# Patient Record
Sex: Male | Born: 1935 | Race: White | Hispanic: No | Marital: Married | Smoking: Never smoker
Health system: Southern US, Community
[De-identification: ages and names within clinical notes are randomized; demographics above are authoritative.]

## PROBLEM LIST (undated history)

## (undated) DIAGNOSIS — Z860100 Personal history of colon polyps, unspecified: Secondary | ICD-10-CM

## (undated) DIAGNOSIS — E785 Hyperlipidemia, unspecified: Secondary | ICD-10-CM

## (undated) DIAGNOSIS — E119 Type 2 diabetes mellitus without complications: Secondary | ICD-10-CM

## (undated) DIAGNOSIS — I10 Essential (primary) hypertension: Secondary | ICD-10-CM

## (undated) DIAGNOSIS — N400 Enlarged prostate without lower urinary tract symptoms: Secondary | ICD-10-CM

## (undated) DIAGNOSIS — M5136 Other intervertebral disc degeneration, lumbar region: Secondary | ICD-10-CM

## (undated) DIAGNOSIS — F419 Anxiety disorder, unspecified: Secondary | ICD-10-CM

## (undated) DIAGNOSIS — K219 Gastro-esophageal reflux disease without esophagitis: Secondary | ICD-10-CM

## (undated) DIAGNOSIS — M51369 Other intervertebral disc degeneration, lumbar region without mention of lumbar back pain or lower extremity pain: Secondary | ICD-10-CM

## (undated) DIAGNOSIS — J0191 Acute recurrent sinusitis, unspecified: Secondary | ICD-10-CM

## (undated) DIAGNOSIS — K222 Esophageal obstruction: Secondary | ICD-10-CM

## (undated) DIAGNOSIS — Z9109 Other allergy status, other than to drugs and biological substances: Secondary | ICD-10-CM

## (undated) DIAGNOSIS — Z87442 Personal history of urinary calculi: Secondary | ICD-10-CM

## (undated) HISTORY — PX: COLONOSCOPY: SHX174

## (undated) HISTORY — PX: SHOULDER SURGERY: SHX246

## (undated) HISTORY — PX: KNEE SURGERY: SHX244

## (undated) HISTORY — DX: Essential (primary) hypertension: I10

## (undated) HISTORY — PX: OTHER SURGICAL HISTORY: SHX169

## (undated) HISTORY — PX: HERNIA REPAIR: SHX51

## (undated) HISTORY — PX: CATARACT EXTRACTION W/ INTRAOCULAR LENS IMPLANT: SHX1309

## (undated) HISTORY — PX: HEMORRHOID SURGERY: SHX153

---

## 2002-02-28 ENCOUNTER — Encounter: Payer: Self-pay | Admitting: Urology

## 2002-03-02 ENCOUNTER — Ambulatory Visit (HOSPITAL_BASED_OUTPATIENT_CLINIC_OR_DEPARTMENT_OTHER): Admission: RE | Admit: 2002-03-02 | Discharge: 2002-03-02 | Payer: Self-pay | Admitting: Urology

## 2002-03-02 ENCOUNTER — Encounter: Payer: Self-pay | Admitting: Urology

## 2004-08-21 ENCOUNTER — Ambulatory Visit: Payer: Self-pay | Admitting: Unknown Physician Specialty

## 2004-11-13 ENCOUNTER — Ambulatory Visit: Payer: Self-pay | Admitting: Unknown Physician Specialty

## 2007-05-26 ENCOUNTER — Ambulatory Visit: Payer: Self-pay | Admitting: Unknown Physician Specialty

## 2007-08-30 ENCOUNTER — Emergency Department: Payer: Self-pay | Admitting: Emergency Medicine

## 2007-09-09 ENCOUNTER — Emergency Department: Payer: Self-pay | Admitting: Emergency Medicine

## 2008-06-14 ENCOUNTER — Ambulatory Visit: Payer: Self-pay | Admitting: Urology

## 2009-01-08 ENCOUNTER — Ambulatory Visit: Payer: Self-pay | Admitting: Internal Medicine

## 2009-03-26 ENCOUNTER — Ambulatory Visit: Payer: Self-pay | Admitting: Unknown Physician Specialty

## 2010-09-12 ENCOUNTER — Ambulatory Visit: Payer: Self-pay | Admitting: Unknown Physician Specialty

## 2010-09-13 LAB — PATHOLOGY REPORT

## 2011-09-15 ENCOUNTER — Ambulatory Visit: Payer: Self-pay | Admitting: Unknown Physician Specialty

## 2011-09-17 LAB — PATHOLOGY REPORT

## 2012-10-01 ENCOUNTER — Ambulatory Visit: Payer: Self-pay | Admitting: Internal Medicine

## 2012-11-09 ENCOUNTER — Emergency Department: Payer: Self-pay | Admitting: Emergency Medicine

## 2012-11-18 ENCOUNTER — Emergency Department: Payer: Self-pay | Admitting: Emergency Medicine

## 2013-03-31 ENCOUNTER — Encounter: Payer: Self-pay | Admitting: Podiatry

## 2013-04-04 ENCOUNTER — Ambulatory Visit: Payer: Self-pay | Admitting: Podiatry

## 2013-04-07 ENCOUNTER — Ambulatory Visit (INDEPENDENT_AMBULATORY_CARE_PROVIDER_SITE_OTHER): Payer: Medicare Other | Admitting: Podiatry

## 2013-04-07 ENCOUNTER — Encounter: Payer: Self-pay | Admitting: Podiatry

## 2013-04-07 VITALS — BP 157/92 | HR 83 | Resp 18 | Ht 67.0 in | Wt 150.0 lb

## 2013-04-07 DIAGNOSIS — L600 Ingrowing nail: Secondary | ICD-10-CM

## 2013-04-07 NOTE — Progress Notes (Signed)
He took these ingrown toenails out , and it was bothering me last week now its better , so  i figured i let him check them out.  Objective: Vital signs are stable he is alert and oriented x3. He has a slight nail dystrophy to the hallux bilateral. Pulses are bilaterally palpable.  Assessment: Nail dystrophy no ingrown toenails to the hallux bilateral.  Plan: Debridement a small portion of the nail distally and I will followup with him as needed appear

## 2013-06-29 DIAGNOSIS — M5137 Other intervertebral disc degeneration, lumbosacral region: Secondary | ICD-10-CM | POA: Insufficient documentation

## 2013-06-29 DIAGNOSIS — M1990 Unspecified osteoarthritis, unspecified site: Secondary | ICD-10-CM | POA: Insufficient documentation

## 2013-06-29 DIAGNOSIS — I10 Essential (primary) hypertension: Secondary | ICD-10-CM | POA: Insufficient documentation

## 2013-06-29 DIAGNOSIS — K635 Polyp of colon: Secondary | ICD-10-CM | POA: Insufficient documentation

## 2013-06-29 DIAGNOSIS — F419 Anxiety disorder, unspecified: Secondary | ICD-10-CM | POA: Insufficient documentation

## 2013-06-29 DIAGNOSIS — N200 Calculus of kidney: Secondary | ICD-10-CM | POA: Insufficient documentation

## 2013-06-29 DIAGNOSIS — R739 Hyperglycemia, unspecified: Secondary | ICD-10-CM | POA: Insufficient documentation

## 2013-06-29 DIAGNOSIS — E785 Hyperlipidemia, unspecified: Secondary | ICD-10-CM | POA: Insufficient documentation

## 2013-09-16 DIAGNOSIS — K219 Gastro-esophageal reflux disease without esophagitis: Secondary | ICD-10-CM | POA: Insufficient documentation

## 2013-09-16 DIAGNOSIS — R1310 Dysphagia, unspecified: Secondary | ICD-10-CM | POA: Insufficient documentation

## 2013-09-16 DIAGNOSIS — K222 Esophageal obstruction: Secondary | ICD-10-CM

## 2013-09-29 ENCOUNTER — Ambulatory Visit: Payer: Self-pay | Admitting: Unknown Physician Specialty

## 2013-10-04 LAB — PATHOLOGY REPORT

## 2014-11-01 ENCOUNTER — Encounter: Payer: Self-pay | Admitting: Emergency Medicine

## 2014-11-01 ENCOUNTER — Emergency Department
Admission: EM | Admit: 2014-11-01 | Discharge: 2014-11-01 | Disposition: A | Discharge status: Home / Self Care | Payer: Medicare Other | Attending: Emergency Medicine | Admitting: Emergency Medicine

## 2014-11-01 DIAGNOSIS — Y998 Other external cause status: Secondary | ICD-10-CM | POA: Diagnosis not present

## 2014-11-01 DIAGNOSIS — Y9289 Other specified places as the place of occurrence of the external cause: Secondary | ICD-10-CM | POA: Insufficient documentation

## 2014-11-01 DIAGNOSIS — I10 Essential (primary) hypertension: Secondary | ICD-10-CM | POA: Insufficient documentation

## 2014-11-01 DIAGNOSIS — S71112A Laceration without foreign body, left thigh, initial encounter: Secondary | ICD-10-CM | POA: Diagnosis not present

## 2014-11-01 DIAGNOSIS — Y9389 Activity, other specified: Secondary | ICD-10-CM | POA: Insufficient documentation

## 2014-11-01 DIAGNOSIS — Y288XXA Contact with other sharp object, undetermined intent, initial encounter: Secondary | ICD-10-CM | POA: Insufficient documentation

## 2014-11-01 DIAGNOSIS — Z79899 Other long term (current) drug therapy: Secondary | ICD-10-CM | POA: Diagnosis not present

## 2014-11-01 DIAGNOSIS — Z880 Allergy status to penicillin: Secondary | ICD-10-CM | POA: Insufficient documentation

## 2014-11-01 MED ORDER — OXYCODONE-ACETAMINOPHEN 7.5-325 MG PO TABS: 1.0000 | ORAL_TABLET | Freq: Four times a day (QID) | ORAL | Status: DC | PRN

## 2014-11-01 MED ORDER — LIDOCAINE-EPINEPHRINE (PF) 1 %-1:200000 IJ SOLN
INTRAMUSCULAR | Status: AC
  Filled 2014-11-01: qty 30

## 2014-11-01 MED ORDER — DOUBLE ANTIBIOTIC 500-10000 UNIT/GM EX OINT
TOPICAL_OINTMENT | Freq: Once | CUTANEOUS | Status: DC
  Filled 2014-11-01: qty 1

## 2014-11-01 MED ORDER — BACITRACIN-NEOMYCIN-POLYMYXIN OINTMENT TUBE
TOPICAL_OINTMENT | Freq: Once | CUTANEOUS | Status: AC
  Administered 2014-11-01: 15:00:00 via TOPICAL
  Filled 2014-11-01: qty 15

## 2014-11-01 MED ORDER — BACITRACIN-NEOMYCIN-POLYMYXIN 400-5-5000 EX OINT
TOPICAL_OINTMENT | CUTANEOUS | Status: AC
  Filled 2014-11-01: qty 1

## 2014-11-01 MED ORDER — BACITRACIN-NEOMYCIN-POLYMYXIN OINTMENT TUBE: TOPICAL_OINTMENT | Freq: Once | CUTANEOUS | Status: DC

## 2014-11-01 NOTE — ED Notes (Signed)
AAOx3.  Skin warm and dry.  NAD 

## 2014-11-01 NOTE — ED Provider Notes (Signed)
[facility] Emergency Department Provider Note  ____________________________________________  Time seen: Approximately 2:07 PM  I have reviewed the triage vital signs and the nursing notes.   HISTORY  Chief Complaint Extremity Laceration    HPI Robert Alexander is a 79 y.o. male patient were 5 cm laceration to the left upper anterior thigh. Laceration from skill saw. Hemorrhage and control with direct pressure.Patient denies any loss sensation or movement of the left lower extremity. Patient is rating his pain as a 10 over 10. No palliative measures except for pressure dressing for this complaint. Patient states tetanus shot is up-to-date.   Past Medical History  Diagnosis Date  . Hypertension     There are no active problems to display for this patient.   Past Surgical History  Procedure Laterality Date  . Kidney stones      Current Outpatient Rx  Name  Route  Sig  Dispense  Refill  . amLODipine (NORVASC) 5 MG tablet               . LORazepam (ATIVAN) 1 MG tablet               . oxyCODONE-acetaminophen (PERCOCET) 7.5-325 MG tablet   Oral   Take 1 tablet by mouth every 6 (six) hours as needed for severe pain.   12 tablet   0     Allergies Penicillins  No family history on file.  Social History Social History  Substance Use Topics  . Smoking status: Never Smoker   . Smokeless tobacco: Current User    Types: Chew  . Alcohol Use: No    Review of Systems Constitutional: No fever/chills Eyes: No visual changes. ENT: No sore throat. Cardiovascular: Denies chest pain. Respiratory: Denies shortness of breath. Gastrointestinal: No abdominal pain.  No nausea, no vomiting.  No diarrhea.  No constipation. Genitourinary: Negative for dysuria. Musculoskeletal: Negative for back pain. Skin: Negative for rash. Laceration left thigh. Neurological: Negative for headaches, focal weakness or  numbness. Endocrine:Hypertension Allergic/Immunilogical: Penicillin 10-point ROS otherwise negative.  ____________________________________________   PHYSICAL EXAM:  VITAL SIGNS: ED Triage Vitals  Enc Vitals Group     BP 11/01/14 1344 152/76 mmHg     Pulse Rate 11/01/14 1344 102     Resp 11/01/14 1344 20     Temp 11/01/14 1344 97.7 F (36.5 C)     Temp Source 11/01/14 1344 Oral     SpO2 11/01/14 1344 96 %     Weight 11/01/14 1344 150 lb (68.04 kg)     Height 11/01/14 1344  (1.702 m)     Head Cir --      Peak Flow --      Pain Score 11/01/14 1345 10     Pain Loc --      Pain Edu? --      Excl. in GC? --     Constitutional: Alert and oriented. Well appearing and in no acute distress. Eyes: Conjunctivae are normal. PERRL. EOMI. Head: Atraumatic. Nose: No congestion/rhinnorhea. Mouth/Throat: Mucous membranes are moist.  Oropharynx non-erythematous. Neck: No stridor.  No cervical spine tenderness to palpation. Hematological/Lymphatic/Immunilogical: No cervical lymphadenopathy. Cardiovascular: Normal rate, regular rhythm. Grossly normal heart sounds.  Good peripheral circulation. Mild elevation of blood pressure Respiratory: Normal respiratory effort.  No retractions. Lungs CTAB. Gastrointestinal: Soft and nontender. No distention. No abdominal bruits. No CVA tenderness. Musculoskeletal: No lower extremity tenderness nor edema.  No joint effusions. Neurologic:  Normal speech and language. No gross  focal neurologic deficits are appreciated. No gait instability. Skin:  Skin is warm, dry and intact. No rash noted. 5 cm laceration anterior left thigh. Psychiatric: Mood and affect are normal. Speech and behavior are normal.  ____________________________________________   LABS (all labs ordered are listed, but only abnormal results are displayed)  Labs Reviewed - No data to  display ____________________________________________  EKG   ____________________________________________  RADIOLOGY   ____________________________________________   PROCEDURES  Procedure(s) performed: See procedure note  LACERATION REPAIR Performed by: Joni Reining Authorized by: Joni Reining Consent: Verbal consent obtained. Risks and benefits: risks, benefits and alternatives were discussed Consent given by: patient Patient identity confirmed: provided demographic data Prepped and Draped in normal sterile fashion Wound explored  Laceration Location: Anterior left upper thigh  Laceration Length: 5 cm  No Foreign Bodies seen or palpated  Anesthesia: local infiltration  Local anesthetic: lidocaine 1% with epinephrine  Anesthetic total: 10 ML's   Irrigation method: syringe Amount of cleaning: standard  Skin closure: 4-0 Vicryls and 3-0 nylon   Number of sutures: A subcutaneous and 11 cutaneous  Technique: Simple Patient tolerance: Patient tolerated the procedure well with no immediate complications.  Critical Care performed: No  ____________________________________________   INITIAL IMPRESSION / ASSESSMENT AND PLAN / ED COURSE  Pertinent labs & imaging results that were available during my care of the patient were reviewed by me and considered in my medical decision making (see chart for details). Laceration left thigh. Patient given advice on wound care. Patient get a prescription for Percocets to take as directed for pain. Patient advised return back in 10 days for suture removal either by his PCP or the ER ____________________________________________   FINAL CLINICAL IMPRESSION(S) / ED DIAGNOSES  Final diagnoses:  Thigh laceration, left, initial encounter      Joni Reining, PA-C 11/01/14 1454  Richardean Canal, MD 11/01/14 [phone]

## 2014-11-01 NOTE — ED Notes (Addendum)
Pt with three inch laceration to left upper thigh, 3 layers deep with a skill saw. Cms intact.

## 2014-11-01 NOTE — Discharge Instructions (Signed)
Laceration Care, Adult  A laceration is a cut that goes through all layers of the skin. The cut also goes into the tissue that is right under the skin. Some cuts heal on their own. Others need to be closed with stitches (sutures), staples, skin adhesive strips, or wound glue. Taking care of your cut lowers your risk of infection and helps your cut to heal better.  HOW TO TAKE CARE OF YOUR CUT  For stitches or staples:  · Keep the wound clean and dry.  · If you were given a bandage (dressing), you should change it at least one time per day or as told by your doctor. You should also change it if it gets wet or dirty.  · Keep the wound completely dry for the first 24 hours or as told by your doctor. After that time, you may take a shower or a bath. However, make sure that the wound is not soaked in water until after the stitches or staples have been removed.  · Clean the wound one time each day or as told by your doctor:    Wash the wound with soap and water.    Rinse the wound with water until all of the soap comes off.    Pat the wound dry with a clean towel. Do not rub the wound.  · After you clean the wound, put a thin layer of antibiotic ointment on it as told by your doctor. This ointment:    Helps to prevent infection.    Keeps the bandage from sticking to the wound.  · Have your stitches or staples removed as told by your doctor.  If your doctor used skin adhesive strips:   · Keep the wound clean and dry.  · If you were given a bandage, you should change it at least one time per day or as told by your doctor. You should also change it if it gets dirty or wet.  · Do not get the skin adhesive strips wet. You can take a shower or a bath, but be careful to keep the wound dry.  · If the wound gets wet, pat it dry with a clean towel. Do not rub the wound.  · Skin adhesive strips fall off on their own. You can trim the strips as the wound heals. Do not remove any strips that are still stuck to the wound. They will  fall off after a while.  If your doctor used wound glue:  · Try to keep your wound dry, but you may briefly wet it in the shower or bath. Do not soak the wound in water, such as by swimming.  · After you take a shower or a bath, gently pat the wound dry with a clean towel. Do not rub the wound.  · Do not do any activities that will make you really sweaty until the skin glue has fallen off on its own.  · Do not apply liquid, cream, or ointment medicine to your wound while the skin glue is still on.  · If you were given a bandage, you should change it at least one time per day or as told by your doctor. You should also change it if it gets dirty or wet.  · If a bandage is placed over the wound, do not let the tape for the bandage touch the skin glue.  · Do not pick at the glue. The skin glue usually stays on for 5-10 days. Then, it   falls off of the skin.  General Instructions   · To help prevent scarring, make sure to cover your wound with sunscreen whenever you are outside after stitches are removed, after adhesive strips are removed, or when wound glue stays in place and the wound is healed. Make sure to wear a sunscreen of at least 30 SPF.  · Take over-the-counter and prescription medicines only as told by your doctor.  · If you were given antibiotic medicine or ointment, take or apply it as told by your doctor. Do not stop using the antibiotic even if your wound is getting better.  · Do not scratch or pick at the wound.  · Keep all follow-up visits as told by your doctor. This is important.  · Check your wound every day for signs of infection. Watch for:    Redness, swelling, or pain.    Fluid, blood, or pus.  · Raise (elevate) the injured area above the level of your heart while you are sitting or lying down, if possible.  GET HELP IF:  · You got a tetanus shot and you have any of these problems at the injection site:    Swelling.    Very bad pain.    Redness.    Bleeding.  · You have a fever.  · A wound that was  closed breaks open.  · You notice a bad smell coming from your wound or your bandage.  · You notice something coming out of the wound, such as wood or glass.  · Medicine does not help your pain.  · You have more redness, swelling, or pain at the site of your wound.  · You have fluid, blood, or pus coming from your wound.  · You notice a change in the color of your skin near your wound.  · You need to change the bandage often because fluid, blood, or pus is coming from the wound.  · You start to have a new rash.  · You start to have numbness around the wound.  GET HELP RIGHT AWAY IF:  · You have very bad swelling around the wound.  · Your pain suddenly gets worse and is very bad.  · You notice painful lumps near the wound or on skin that is anywhere on your body.  · You have a red streak going away from your wound.  · The wound is on your hand or foot and you cannot move a finger or toe like you usually can.  · The wound is on your hand or foot and you notice that your fingers or toes look pale or bluish.     This information is not intended to replace advice given to you by your health care provider. Make sure you discuss any questions you have with your health care provider.     Document Released: 07/02/2007 Document Revised: 05/30/2014 Document Reviewed: 01/09/2014  Elsevier Interactive Patient Education ©2016 [employer]

## 2014-11-13 ENCOUNTER — Emergency Department
Admission: EM | Admit: 2014-11-13 | Discharge: 2014-11-13 | Disposition: A | Discharge status: Home / Self Care | Payer: Medicare Other | Attending: Emergency Medicine | Admitting: Emergency Medicine

## 2014-11-13 DIAGNOSIS — Z4801 Encounter for change or removal of surgical wound dressing: Secondary | ICD-10-CM | POA: Insufficient documentation

## 2014-11-13 DIAGNOSIS — Z79899 Other long term (current) drug therapy: Secondary | ICD-10-CM | POA: Insufficient documentation

## 2014-11-13 DIAGNOSIS — I10 Essential (primary) hypertension: Secondary | ICD-10-CM | POA: Diagnosis not present

## 2014-11-13 DIAGNOSIS — Z880 Allergy status to penicillin: Secondary | ICD-10-CM | POA: Diagnosis not present

## 2014-11-13 DIAGNOSIS — Z4802 Encounter for removal of sutures: Secondary | ICD-10-CM

## 2014-11-13 NOTE — ED Provider Notes (Signed)
[facility] Emergency Department Provider Note   ____________________________________________  Time seen: Approximately 9:32 AM  I have reviewed the triage vital signs and the nursing notes.   HISTORY  Chief Complaint Suture / Staple Removal    HPI Robert Alexander is a 79 y.o. male who presents for suture removal. He was here 11/01/2014 for laceration from a skill saw, which was subsequently sutured in the ED.    Past Medical History  Diagnosis Date  . Hypertension     There are no active problems to display for this patient.   Past Surgical History  Procedure Laterality Date  . Kidney stones      Current Outpatient Rx  Name  Route  Sig  Dispense  Refill  . amLODipine (NORVASC) 5 MG tablet               . LORazepam (ATIVAN) 1 MG tablet               . oxyCODONE-acetaminophen (PERCOCET) 7.5-325 MG tablet   Oral   Take 1 tablet by mouth every 6 (six) hours as needed for severe pain.   12 tablet   0     Allergies Penicillins  No family history on file.  Social History Social History  Substance Use Topics  . Smoking status: Never Smoker   . Smokeless tobacco: Current User    Types: Chew  . Alcohol Use: No    Review of Systems  Constitutional: No fever/chills Musculoskeletal: Negative for hip or knee pain. Skin: Negative for rash. Laceration on anterior left thigh. Denies red streaking or discharge. Neurological: Negative for numbness or tingling in feet bilaterally.  10-point ROS otherwise negative.  ____________________________________________   PHYSICAL EXAM:  VITAL SIGNS: ED Triage Vitals  Enc Vitals Group     BP 11/13/14 0847 173/71 mmHg     Pulse Rate 11/13/14 0847 61     Resp 11/13/14 0847 16     Temp 11/13/14 0847 97.6 F (36.4 C)     Temp Source 11/13/14 0847 Oral     SpO2 11/13/14 0847 97 %     Weight 11/13/14 0846 150 lb (68.04 kg)     Height 11/13/14 0846 5\' 7"  (1.702 m)     Head Cir --    Peak Flow --      Pain Score --      Pain Loc --      Pain Edu? --      Excl. in GC? --     Constitutional: Alert and oriented. Well appearing and in no acute distress. Musculoskeletal: Left anterior thigh has laceration with several sutures, healing well with pink granulation tissue. No discharge. No lower extremity tenderness nor edema.  No joint effusions. Neurologic:  Normal speech and language. No gross focal neurologic deficits are appreciated. No gait instability. Skin:  Skin is warm, dry and intact. No rash noted. Psychiatric: Mood and affect are normal. Speech and behavior are normal.  ____________________________________________   LABS (all labs ordered are listed, but only abnormal results are displayed)  Labs Reviewed - No data to display ____________________________________________  EKG  None. ____________________________________________  RADIOLOGY  None. ____________________________________________   PROCEDURES  Procedure(s) performed: Suture removal; dressed with benzoin, Steri strips, and Telfa.  Critical Care performed: No  ____________________________________________   INITIAL IMPRESSION / ASSESSMENT AND PLAN / ED COURSE  Pertinent labs & imaging results that were available during my care of the patient were reviewed by me and considered  in my medical decision making (see chart for details).  Laceration left anterior thigh, healing well. ____________________________________________   FINAL CLINICAL IMPRESSION(S) / ED DIAGNOSES  Final diagnoses:  Encounter for removal of sutures      Evangeline Dakin, PA-C 11/13/14 9604  Jennye Moccasin, MD 11/13/14 1136

## 2014-11-13 NOTE — ED Notes (Signed)
Pt here for suture removal from the left upper thigh..[name]

## 2014-11-13 NOTE — Discharge Instructions (Signed)

## 2015-01-22 ENCOUNTER — Emergency Department (HOSPITAL_COMMUNITY): Payer: Medicare Other

## 2015-01-22 ENCOUNTER — Emergency Department (HOSPITAL_COMMUNITY)
Admission: EM | Admit: 2015-01-22 | Discharge: 2015-01-22 | Disposition: A | Discharge status: Home / Self Care | Payer: Medicare Other | Attending: Emergency Medicine | Admitting: Emergency Medicine

## 2015-01-22 ENCOUNTER — Encounter (HOSPITAL_COMMUNITY): Payer: Self-pay | Admitting: Emergency Medicine

## 2015-01-22 DIAGNOSIS — Z880 Allergy status to penicillin: Secondary | ICD-10-CM | POA: Insufficient documentation

## 2015-01-22 DIAGNOSIS — R52 Pain, unspecified: Secondary | ICD-10-CM

## 2015-01-22 DIAGNOSIS — I10 Essential (primary) hypertension: Secondary | ICD-10-CM | POA: Diagnosis not present

## 2015-01-22 DIAGNOSIS — Z79899 Other long term (current) drug therapy: Secondary | ICD-10-CM | POA: Diagnosis not present

## 2015-01-22 DIAGNOSIS — R109 Unspecified abdominal pain: Secondary | ICD-10-CM | POA: Insufficient documentation

## 2015-01-22 LAB — COMPREHENSIVE METABOLIC PANEL
ALBUMIN: 3.8 g/dL (ref 3.5–5.0)
ALT: 19 U/L (ref 17–63)
ANION GAP: 8 (ref 5–15)
AST: 18 U/L (ref 15–41)
Alkaline Phosphatase: 74 U/L (ref 38–126)
BILIRUBIN TOTAL: 1.1 mg/dL (ref 0.3–1.2)
BUN: 20 mg/dL (ref 6–20)
CHLORIDE: 101 mmol/L (ref 101–111)
CO2: 30 mmol/L (ref 22–32)
Calcium: 8.8 mg/dL — ABNORMAL LOW (ref 8.9–10.3)
Creatinine, Ser: 0.93 mg/dL (ref 0.61–1.24)
GFR calc Af Amer: >60 mL/min (ref >60)
GFR calc non Af Amer: >60 mL/min (ref >60)
GLUCOSE: 104 mg/dL — AB (ref 65–99)
POTASSIUM: 3.8 mmol/L (ref 3.5–5.1)
SODIUM: 139 mmol/L (ref 135–145)
TOTAL PROTEIN: 6.9 g/dL (ref 6.5–8.1)

## 2015-01-22 LAB — CBC
HEMATOCRIT: 45.2 % (ref 39.0–52.0)
HEMOGLOBIN: 15.6 g/dL (ref 13.0–17.0)
MCH: 32.3 pg (ref 26.0–34.0)
MCHC: 34.5 g/dL (ref 30.0–36.0)
MCV: 93.6 fL (ref 78.0–100.0)
Platelets: 255 10*3/uL (ref 150–400)
RBC: 4.83 MIL/uL (ref 4.22–5.81)
RDW: 13.2 % (ref 11.5–15.5)
WBC: 14.1 10*3/uL — ABNORMAL HIGH (ref 4.0–10.5)

## 2015-01-22 LAB — URINALYSIS, ROUTINE W REFLEX MICROSCOPIC
BILIRUBIN URINE: NEGATIVE
GLUCOSE, UA: NEGATIVE mg/dL
Hgb urine dipstick: NEGATIVE
Ketones, ur: NEGATIVE mg/dL
Leukocytes, UA: NEGATIVE
NITRITE: NEGATIVE
PH: 6 (ref 5.0–8.0)
Protein, ur: 100 mg/dL — AB
SPECIFIC GRAVITY, URINE: 1.021 (ref 1.005–1.030)

## 2015-01-22 LAB — URINE MICROSCOPIC-ADD ON: Squamous Epithelial / LPF: NONE SEEN

## 2015-01-22 LAB — LIPASE, BLOOD: Lipase: 39 U/L (ref 11–51)

## 2015-01-22 MED ORDER — ONDANSETRON HCL 4 MG/2ML IJ SOLN
4.0000 mg | Freq: Once | INTRAMUSCULAR | Status: AC
  Administered 2015-01-22: 4 mg via INTRAVENOUS
  Filled 2015-01-22: qty 2

## 2015-01-22 MED ORDER — KETOROLAC TROMETHAMINE 30 MG/ML IJ SOLN
15.0000 mg | Freq: Once | INTRAMUSCULAR | Status: AC
  Administered 2015-01-22: 15 mg via INTRAVENOUS
  Filled 2015-01-22: qty 1

## 2015-01-22 MED ORDER — HYDROCODONE-ACETAMINOPHEN 5-325 MG PO TABS: 1.0000 | ORAL_TABLET | Freq: Four times a day (QID) | ORAL | Status: DC | PRN

## 2015-01-22 NOTE — ED Notes (Signed)
Pt states that he has been having "bumps on his butt", then he "had a jaw tooth that needed to be taken out" so he went to the dentist, then he went to the doctor about the "bumps on his butt" and was dx with shingles.  States that he was put on some medicine but didn't finish it.  States that last night, he began having LLQ/ lt flank pain with dysuria.

## 2015-01-22 NOTE — ED Provider Notes (Signed)
CSN: [id number]     Arrival date & time 01/22/15  4098 History   First MD Initiated Contact with Patient 01/22/15 1022     Chief Complaint  Patient presents with  . Flank Pain     (Consider location/radiation/quality/duration/timing/severity/associated sxs/prior Treatment) Patient is a 79 y.o. male presenting with flank pain. The history is provided by the patient (Patient complains of left flank pain. He is being treated for shingles in his back and buttocks).  Flank Pain This is a new problem. The current episode started 2 days ago. The problem occurs constantly. The problem has not changed since onset.Associated symptoms include abdominal pain. Pertinent negatives include no chest pain and no headaches. Nothing aggravates the symptoms. Nothing relieves the symptoms.    Past Medical History  Diagnosis Date  . Hypertension    Past Surgical History  Procedure Laterality Date  . Kidney stones     No family history on file. Social History  Substance Use Topics  . Smoking status: Never Smoker   . Smokeless tobacco: Current User    Types: Chew  . Alcohol Use: No    Review of Systems  Constitutional: Negative for appetite change and fatigue.  HENT: Negative for congestion, ear discharge and sinus pressure.   Eyes: Negative for discharge.  Respiratory: Negative for cough.   Cardiovascular: Negative for chest pain.  Gastrointestinal: Positive for abdominal pain. Negative for diarrhea.  Genitourinary: Positive for flank pain. Negative for frequency and hematuria.  Musculoskeletal: Negative for back pain.  Skin: Negative for rash.  Neurological: Negative for seizures and headaches.  Psychiatric/Behavioral: Negative for hallucinations.      Allergies  Penicillins  Home Medications   Prior to Admission medications   Medication Sig Start Date End Date Taking? Authorizing Provider  acetaminophen (TYLENOL) 325 MG tablet Take 325 mg by mouth daily.   Yes Historical Provider,  MD  amLODipine (NORVASC) 5 MG tablet Take 5 mg by mouth daily.  04/03/13  Yes Historical Provider, MD  celecoxib (CELEBREX) 200 MG capsule Take 200 mg by mouth daily as needed for moderate pain.  01/02/15  Yes Historical Provider, MD  esomeprazole (NEXIUM) 40 MG capsule Take 40 mg by mouth daily as needed (upset stomach).  09/16/13  Yes Historical Provider, MD  finasteride (PROPECIA) 1 MG tablet Take 1 mg by mouth daily. 01/15/15  Yes Historical Provider, MD  LORazepam (ATIVAN) 1 MG tablet Take 1 mg by mouth at bedtime.  03/08/13  Yes Historical Provider, MD  triamcinolone cream (KENALOG) 0.1 % Apply 1 application topically 2 (two) times daily. 01/12/15 01/12/16 Yes Historical Provider, MD  HYDROcodone-acetaminophen (NORCO/VICODIN) 5-325 MG tablet Take 1 tablet by mouth every 6 (six) hours as needed for moderate pain. 01/22/15   Bethann Berkshire, MD  oxyCODONE-acetaminophen (PERCOCET) 7.5-325 MG tablet Take 1 tablet by mouth every 6 (six) hours as needed for severe pain. Patient not taking: Reported on 01/22/2015 11/01/14   Joni Reining, PA-C   BP 146/78 mmHg  Pulse 62  Temp(Src) 98.3 F (36.8 C) (Oral)  Resp 18  SpO2 98% Physical Exam  Constitutional: He is oriented to person, place, and time. He appears well-developed.  HENT:  Head: Normocephalic.  Eyes: Conjunctivae and EOM are normal. No scleral icterus.  Neck: Neck supple. No thyromegaly present.  Cardiovascular: Normal rate and regular rhythm.  Exam reveals no gallop and no friction rub.   No murmur heard. Pulmonary/Chest: No stridor. He has no wheezes. He has no rales. He exhibits no tenderness.  Abdominal: He exhibits no distension. There is no tenderness. There is no rebound.  Genitourinary:  Mild tender left flank  Musculoskeletal: Normal range of motion. He exhibits no edema.  Lymphadenopathy:    He has no cervical adenopathy.  Neurological: He is oriented to person, place, and time. He exhibits normal muscle tone. Coordination  normal.  Skin: No rash noted. No erythema.  Psychiatric: He has a normal mood and affect. His behavior is normal.    ED Course  Procedures (including critical care time) Labs Review Labs Reviewed  COMPREHENSIVE METABOLIC PANEL - Abnormal; Notable for the following:    Glucose, Bld 104 (*)    Calcium 8.8 (*)    All other components within normal limits  CBC - Abnormal; Notable for the following:    WBC 14.1 (*)    All other components within normal limits  URINALYSIS, ROUTINE W REFLEX MICROSCOPIC (NOT AT [facility]) - Abnormal; Notable for the following:    Protein, ur 100 (*)    All other components within normal limits  URINE MICROSCOPIC-ADD ON - Abnormal; Notable for the following:    Bacteria, UA RARE (*)    All other components within normal limits  LIPASE, BLOOD    Imaging Review Ct Renal Stone Study  01/22/2015  CLINICAL DATA:  Acute left flank and lower quadrant pain with dysuria. EXAM: CT ABDOMEN AND PELVIS WITHOUT CONTRAST TECHNIQUE: Multidetector CT imaging of the abdomen and pelvis was performed following the standard protocol without IV contrast. COMPARISON:  10/01/2012 FINDINGS: Lower chest: Minimal dependent basilar atelectasis. Normal heart size. No pericardial or pleural effusion. Hepatobiliary: No mass visualized on this un-enhanced exam. Pancreas: No mass or inflammatory process identified on this un-enhanced exam. Spleen: Within normal limits in size. Adrenals/Urinary Tract: Normal adrenal glands. Kidneys demonstrate no acute obstruction, obstructive uropathy, hydronephrosis, or obstructing ureteral calculus on either side. Left kidney upper pole demonstrates a 2.5 cm hypodense cyst, image 12 stable compared to 10/01/2012. Left lower pole hypodense cystic lesion also unchanged, image 26 compatible with a second left renal cyst. Stomach/Bowel: Negative bowel obstruction, dilatation, ileus, or free air. Normal appendix demonstrated. Vascular/Lymphatic: No adenopathy.  Atherosclerosis of the aorta and iliac vessels. No aneurysm or retroperitoneal abnormality. Reproductive: Prostate gland is enlarged with calcification. Seminal vesicles unremarkable. Urinary bladder under distended. Other: No inguinal abnormality or hernia.  Intact abdominal wall. Musculoskeletal: No acute osseous finding. Minor degenerative changes. IMPRESSION: No acute obstructive uropathy, hydronephrosis, or obstructing ureteral calculus on either side. Incidental left renal cysts, unchanged Aortoiliac atherosclerosis No acute intra-abdominal or pelvic process by noncontrast CT Enlarged prostate Electronically Signed   By: Judie PetitM.  Shick M.D.   On: 01/22/2015 11:52   I have personally reviewed and evaluated these images and lab results as part of my medical decision-making.   EKG Interpretation None      MDM   Final diagnoses:  Pain  Flank pain, acute    Labs and CT scan unremarkable. Pain possibly related to shingles. Or musculoskeletal pain. Patient given Vicodin and will follow-up with PCP    Bethann BerkshireJoseph Jadarrius Maselli, MD 01/22/15 1259

## 2015-01-22 NOTE — Discharge Instructions (Signed)
Follow up with your md if not improving. °

## 2015-12-03 ENCOUNTER — Ambulatory Visit (INDEPENDENT_AMBULATORY_CARE_PROVIDER_SITE_OTHER): Payer: Medicare Other | Admitting: Podiatry

## 2015-12-03 ENCOUNTER — Other Ambulatory Visit: Payer: Self-pay | Admitting: *Deleted

## 2015-12-03 ENCOUNTER — Encounter: Payer: Self-pay | Admitting: Podiatry

## 2015-12-03 ENCOUNTER — Ambulatory Visit (INDEPENDENT_AMBULATORY_CARE_PROVIDER_SITE_OTHER): Payer: Medicare Other

## 2015-12-03 ENCOUNTER — Encounter: Payer: Self-pay | Admitting: *Deleted

## 2015-12-03 DIAGNOSIS — G5760 Lesion of plantar nerve, unspecified lower limb: Secondary | ICD-10-CM | POA: Diagnosis not present

## 2015-12-03 DIAGNOSIS — M778 Other enthesopathies, not elsewhere classified: Secondary | ICD-10-CM

## 2015-12-03 DIAGNOSIS — M7750 Other enthesopathy of unspecified foot: Secondary | ICD-10-CM

## 2015-12-03 DIAGNOSIS — M79672 Pain in left foot: Secondary | ICD-10-CM

## 2015-12-03 DIAGNOSIS — Z86010 Personal history of colonic polyps: Secondary | ICD-10-CM | POA: Insufficient documentation

## 2015-12-03 DIAGNOSIS — Z860101 Personal history of adenomatous and serrated colon polyps: Secondary | ICD-10-CM | POA: Insufficient documentation

## 2015-12-03 DIAGNOSIS — G588 Other specified mononeuropathies: Secondary | ICD-10-CM

## 2015-12-03 DIAGNOSIS — M79671 Pain in right foot: Secondary | ICD-10-CM

## 2015-12-03 DIAGNOSIS — M779 Enthesopathy, unspecified: Principal | ICD-10-CM

## 2015-12-03 NOTE — Progress Notes (Signed)
He presents today for chief complaint of plantar forefoot bilateral states that this burning in the forefoot or the ball of my foot for the past several weeks to months. He states it went away initially but now is back again. He states that he had blood work done and circulation tests performed.  Objective: Vital signs are stable alert and oriented 3 pulses are palpable. Neurologic sensorium is intact. Deep tendon reflexes are intact muscle strength is normal. He has no pain on manipulation of the toes. He does however have pain on palpation to the third interdigital space bilateral with a palpable Mulder's click. Radiographs taken in the office today do not demonstrate any type of major osseous abnormalities. 3 views bilateral foot demonstrates mild hammertoe deformities are rectus foot type.  Assessment: Neuroma third interspace bilateral.  Plan: Injected first dose of cortisone third interdigital space of the bilateral foot. Follow up with him in 1 month

## 2016-01-07 ENCOUNTER — Encounter: Payer: Self-pay | Admitting: Podiatry

## 2016-01-07 ENCOUNTER — Ambulatory Visit (INDEPENDENT_AMBULATORY_CARE_PROVIDER_SITE_OTHER): Payer: Medicare Other | Admitting: Podiatry

## 2016-01-07 DIAGNOSIS — G5760 Lesion of plantar nerve, unspecified lower limb: Secondary | ICD-10-CM | POA: Diagnosis not present

## 2016-01-07 DIAGNOSIS — G588 Other specified mononeuropathies: Secondary | ICD-10-CM

## 2016-01-07 NOTE — Progress Notes (Signed)
He presents today for follow-up of his neuroma third interdigital space bilateral.  Objective: Vital signs are stable at 3. Pulses are palpable. Neurologic services intact. Palpable Mulder's click third interspace bilateral. Diminished sensation per Semmes-Weinstein monofilament.  Assessment: Neuroma third interspace bilateral.  Plan: Reinjection bilateral third digittoday with dehydrated alcohol.

## 2016-01-30 ENCOUNTER — Encounter: Payer: Self-pay | Admitting: Podiatry

## 2016-01-30 ENCOUNTER — Ambulatory Visit (INDEPENDENT_AMBULATORY_CARE_PROVIDER_SITE_OTHER): Payer: Medicare Other | Admitting: Podiatry

## 2016-01-30 DIAGNOSIS — G588 Other specified mononeuropathies: Secondary | ICD-10-CM

## 2016-01-30 DIAGNOSIS — G5760 Lesion of plantar nerve, unspecified lower limb: Secondary | ICD-10-CM | POA: Diagnosis not present

## 2016-01-30 NOTE — Progress Notes (Signed)
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  Plan: Reinjected third dose dehydrated alcohol bilateral third interdigital space.

## 2016-02-20 ENCOUNTER — Encounter: Payer: Self-pay | Admitting: Podiatry

## 2016-02-20 ENCOUNTER — Ambulatory Visit (INDEPENDENT_AMBULATORY_CARE_PROVIDER_SITE_OTHER): Payer: Medicare Other | Admitting: Podiatry

## 2016-02-20 DIAGNOSIS — G5760 Lesion of plantar nerve, unspecified lower limb: Secondary | ICD-10-CM | POA: Diagnosis not present

## 2016-02-20 DIAGNOSIS — G588 Other specified mononeuropathies: Secondary | ICD-10-CM

## 2016-02-20 NOTE — Progress Notes (Signed)
Junction stay for follow-up of neuroma to the third interdigital spaces states a starting to feel much better now.  Objective: Vital signs are stable he is alert and oriented 3 still has palpable Mulder's click third interdigital space with pain on palpation.  Assessment: Neuroma third interdigital space bilateral.  Plan: Injected dehydrated alcohol perday follow-up with him in 1 month.

## 2016-03-12 ENCOUNTER — Ambulatory Visit: Payer: Medicare Other | Admitting: Podiatry

## 2016-12-29 ENCOUNTER — Ambulatory Visit: Payer: Medicare Other | Attending: Physician Assistant

## 2016-12-29 DIAGNOSIS — M545 Low back pain, unspecified: Secondary | ICD-10-CM

## 2016-12-29 DIAGNOSIS — M62838 Other muscle spasm: Secondary | ICD-10-CM | POA: Diagnosis present

## 2016-12-29 DIAGNOSIS — M542 Cervicalgia: Secondary | ICD-10-CM | POA: Diagnosis present

## 2016-12-29 NOTE — Therapy (Signed)
[facility] [address] [place], [address] Phone: [phone]   Fax:  [phone]  Physical Therapy Evaluation  Patient Details  Name: Robert Alexander MRN: [id number] Date of Birth: [birth year] Referring Provider: Dayton Bailiff PA   Encounter Date: 12/29/2016  PT End of Session - 12/29/16 1301    Visit Number  1    Number of Visits  13    Date for PT Re-Evaluation  02/09/17    Authorization Type  1 / 10 G Code    PT Start Time  0910    PT Stop Time  1000    PT Time Calculation (min)  50 min    Activity Tolerance  Patient tolerated treatment well    Behavior During Therapy  [facility] for tasks assessed/performed       Past Medical History:  Diagnosis Date  . Hypertension     Past Surgical History:  Procedure Laterality Date  . kidney stones      There were no vitals filed for this visit.   Subjective Assessment - 12/29/16 1253    Subjective  Patient demonstrates increased low back and neck pain s/p attempting to cut tree limbs with a chainsaw 2 weeks ago. Patient reports his neck feels much better and would like to focus on treating his low back. Patient reports increased pain with pulling and pushing large objects (such as large doors), reaching outside of BOS while holding weight, and bending to the side. Patient reports he has a history of low back pain but whenever the increased pain onsets, he rests for 3 days and the pain decreases. Patinet reports this did not happen this time. Patient states his back feels better with the use of ice. Patient reports he recently stopped his exercise routine and feels they may be contributing to his pain. Patient states his pain is not as severe since the onset of symptoms.     Pertinent History  History of skin CA, HTN    Limitations  Lifting    Patient Stated Goals  To decrease pain    Currently in Pain?  Yes    Pain Score  3  worst: 4; best 1/10    Pain  Location  Back    Pain Orientation  Mid;Lower    Pain Descriptors / Indicators  Aching    Pain Type  Chronic pain    Pain Onset  1 to 4 weeks ago    Pain Frequency  Intermittent         [facility] PT Assessment - 12/29/16 0916      Assessment   Medical Diagnosis  Neck and back pain    Referring Provider  Robert TAVANO TUMEY PA    Onset Date/Surgical Date  12/15/16    Hand Dominance  Right    Next MD Visit  unknown    Prior Therapy  no      Balance Screen   Has the patient fallen in the past 6 months  No    Has the patient had a decrease in activity level because of a fear of falling?   No    Is the patient reluctant to leave their home because of a fear of falling?   No      Prior Function   Level of Independence  Independent    Vocation  Retired    Gaffer  N/A    Leisure  [employer]  Cognition   Overall Cognitive Status  Within Functional Limits for tasks assessed      Observation/Other Assessments   Observations  Decreased TrA and multifidus activation with movement    Other Surveys   Other Surveys    Neck Disability Index   8%      Sensation   Light Touch  Appears Intact      Functional Tests   Functional tests  Squat      Squat   Comments  Increase knee translation       Posture/Postural Control   Posture Comments  Decreased lumbar lordosis in sitting and standing       ROM / Strength   AROM / PROM / Strength  AROM;Strength      AROM   AROM Assessment Site  Hip;Lumbar    Right/Left Hip  Left;Right    Right Hip Extension  5    Right Hip Flexion  120    Right Hip External Rotation   40    Right Hip Internal Rotation   30    Right Hip ABduction  40    Right Hip ADduction  20    Left Hip Extension  5    Left Hip Flexion  120    Left Hip External Rotation   40    Left Hip Internal Rotation   30    Left Hip ABduction  40    Left Hip ADduction  20    Lumbar Flexion  WNL    Lumbar Extension  20% limited    Lumbar - Right Side Bend  20% limited  - increased pain    Lumbar - Left Side Bend  WNL    Lumbar - Right Rotation  WNL    Lumbar - Left Rotation  WNL      Strength   Strength Assessment Site  Hip;Knee;Ankle;Cervical;Lumbar    Right/Left Hip  Left;Right    Right Hip Flexion  5/5    Right Hip Extension  4-/5    Right Hip External Rotation   4+/5    Right Hip ABduction  4+/5    Left Hip Flexion  5/5    Left Hip Extension  4-/5    Left Hip External Rotation  4+/5    Left Hip ABduction  4+/5    Right/Left Knee  Right;Left    Right Knee Flexion  5/5    Right Knee Extension  5/5    Left Knee Flexion  5/5    Left Knee Extension  5/5    Cervical Flexion  5/5    Cervical Extension  5/5    Lumbar Flexion  5/5    Lumbar Extension  4-/5      Palpation   Spinal mobility  hypomobility L1-5 centrally    Palpation comment  Increased TTP along multifidus      Special Tests    Special Tests  Lumbar    Lumbar Tests  FABER test;Straight Leg Raise      FABER test   findings  Negative    Side  Right      Straight Leg Raise   Findings  Negative    Side   Right      Ambulation/Gait   Gait Comments  No major decifits noted       Objective measurements completed on examination: See above findings.    TREATMENT: Therapeutic Exercise: Dead Dub -- x 10 in hooklying Multifidus crunch in sitting -- x 10  LTR in  hookying -- x 10    Patient reports decreased pain at end of the session.        PT Education - 12/29/16 1300    Education provided  Yes    Education Details  HEP: LTRs, Dead bug, Multifidus crunch    Person(s) Educated  Patient    Methods  Explanation;Demonstration;Handout    Comprehension  Verbalized understanding;Returned demonstration          PT Long Term Goals - 12/29/16 1318      PT LONG TERM GOAL #1   Title  Patient will be independent with HEP to continue benefits of therapy after discharge from PT.     Baseline  Dependent with technique/form    Time  6    Period  Weeks    Status  New     Target Date  02/09/17      PT LONG TERM GOAL #2   Title  Patient will decrease MODI to under 6% to indicate functional improvement in lumbar function and decrease in pain with functional activity    Time  6    Period  Weeks    Status  New    Target Date  02/09/17      PT LONG TERM GOAL #3   Title  Patient will decreased NDI to 0% to demonstrate decreased pain with functional activities such as playing golf.    Baseline  8 % NDI    Time  6    Period  Weeks    Status  New    Target Date  02/09/17      PT LONG TERM GOAL #4   Title  Patient will have a worst pain of 1/10 or less to indicate singificant improvement in lumbar functioning.    Baseline  4/10 worst pain    Time  6    Period  Weeks    Status  New    Target Date  02/09/17             Plan - 12/29/16 1302    Clinical Impression Statement  Patient is a 81 yo right hand dominant male presenting with increased pain and spasms along his low back after reaching outside his base of support while using his chain saw. Patient demonstrates increased lumbar dysfunction with symptoms most consistent with a mulifidus strain. Patient demonstrates decreased extension weakness and poor motor control. Patient will benefit from further skilled therapy to return to prior level of function.     History and Personal Factors relevant to plan of care:  History of HTN, skin CA    Clinical Presentation  Stable    Clinical Presentation due to:  Improving symptoms    Clinical Decision Making  Low    Rehab Potential  Good    PT Frequency  2x / week    PT Duration  6 weeks    PT Treatment/Interventions  Gait training;Cryotherapy;Therapeutic exercise;Neuromuscular re-education;Patient/family education;Manual techniques;Dry needling;Electrical Stimulation;Iontophoresis 4mg /ml Dexamethasone;Moist Heat;Stair training;Therapeutic activities;Balance training    PT Next Visit Plan  Progress strengthening     PT Home Exercise Plan  See education      Consulted and Agree with Plan of Care  Patient       Patient will benefit from skilled therapeutic intervention in order to improve the following deficits and impairments:  Pain, Decreased coordination, Decreased mobility, Increased muscle spasms  Visit Diagnosis: Bilateral low back pain without sciatica, unspecified chronicity - Plan: PT plan of care cert/re-cert  Cervicalgia -  Plan: PT plan of care cert/re-cert  Other muscle spasm - Plan: PT plan of care cert/re-cert  G-Codes - 12/29/16 1621    Functional Assessment Tool Used (Outpatient Only)  NDI, MODI, clinical judgement, MMT,     Functional Limitation  Changing and maintaining body position    Changing and Maintaining Body Position Current Status ([id number])  At least 1 percent but less than 20 percent impaired, limited or restricted    Changing and Maintaining Body Position Goal Status (U0454(G8982)  At least 1 percent but less than 20 percent impaired, limited or restricted        Problem List Patient Active Problem List   Diagnosis Date Noted  . H/O adenomatous polyp of colon 12/03/2015  . Dysphagia 09/16/2013  . GERD with stricture 09/16/2013  . Anxiety 06/29/2013  . Colon polyps 06/29/2013  . Disc disease, degenerative, lumbar or lumbosacral 06/29/2013  . HTN (hypertension) 06/29/2013  . Hyperglycemia, unspecified 06/29/2013  . Hyperlipidemia, unspecified 06/29/2013  . Kidney stones 06/29/2013  . OA (osteoarthritis) 06/29/2013    Myrene GalasWesley Vernona Peake, PT DPT 12/29/2016, 5:54 PM  [place] [facility] [address] [place], [place], [address] Phone: [phone]   Fax:  [phone]  Name: Robert Alexander MRN: [id number] Date of Birth: 1935/02/22

## 2017-01-06 ENCOUNTER — Ambulatory Visit: Payer: Medicare Other

## 2017-01-08 ENCOUNTER — Ambulatory Visit: Payer: Medicare Other

## 2017-01-08 DIAGNOSIS — M542 Cervicalgia: Secondary | ICD-10-CM

## 2017-01-08 DIAGNOSIS — M545 Low back pain, unspecified: Secondary | ICD-10-CM

## 2017-01-08 DIAGNOSIS — M62838 Other muscle spasm: Secondary | ICD-10-CM

## 2017-01-08 NOTE — Therapy (Signed)
[facility] [facility] [address] [place], [place], [address] Phone: [phone]   Fax:  [phone]  Physical Therapy Treatment  Patient Details  Name: Robert KnackJohn J Alexander MRN: [id number] Date of Birth: 01-24-1936 Referring Provider: Dayton BailiffOBERT Robert TUMEY PA   Encounter Date: 01/08/2017  PT End of Session - 01/08/17 1010    Visit Number  2    Number of Visits  13    Date for PT Re-Evaluation  02/09/17    Authorization Type  2 / 10 G Code    PT Start Time  0945    PT Stop Time  1030    PT Time Calculation (min)  45 min    Activity Tolerance  Patient tolerated treatment well    Behavior During Therapy  [facility] for tasks assessed/performed       Past Medical History:  Diagnosis Date  . Hypertension     Past Surgical History:  Procedure Laterality Date  . kidney stones      There were no vitals filed for this visit.  Subjective Assessment - 01/08/17 1003    Subjective  Patient reports the pain has been decreasing since the previous sessions. Patient states he's been performing exercises at home but has been performing exercises at home.     Pertinent History  History of skin CA, HTN    Limitations  Lifting    Patient Stated Goals  To decrease pain    Currently in Pain?  No/denies    Pain Onset  1 to 4 weeks ago         TREATMENT: Therapeutic Exercise: Marches on airex pad - 2 x 10 Squats in standing with UE support - x 15; x15 without UE support; overhead squats x 10  OMEGA leg press - 2 x 20 75# Hip abduction in standing at hip machine - 2 x 15 B 55#;  Hip extension in standing at hip machine - 2 x 15 B 85# Golf Swing with weight ball - 2 x 20 2kg Low row at [facility] - 2 x 20 15# Reviewed triceps exercises Patient demonstrates increased fatigue at end of session   PT Education - 01/08/17 1007    Education provided  Yes    Education Details  form/technique with exercise    Person(s) Educated  Patient    Methods   Explanation;Demonstration    Comprehension  Verbalized understanding;Returned demonstration          PT Long Term Goals - 12/29/16 1318      PT LONG TERM GOAL #1   Title  Patient will be independent with HEP to continue benefits of therapy after discharge from PT.     Baseline  Dependent with technique/form    Time  6    Period  Weeks    Status  New    Target Date  02/09/17      PT LONG TERM GOAL #2   Title  Patient will decrease MODI to under 6% to indicate functional improvement in lumbar function and decrease in pain with functional activity    Time  6    Period  Weeks    Status  New    Target Date  02/09/17      PT LONG TERM GOAL #3   Title  Patient will decreased NDI to 0% to demonstrate decreased pain with functional activities such as playing golf.    Baseline  8 % NDI    Time  6    Period  Weeks    Status  New    Target Date  02/09/17      PT LONG TERM GOAL #4   Title  Patient will have a worst pain of 1/10 or less to indicate singificant improvement in lumbar functioning.    Baseline  4/10 worst pain    Time  6    Period  Weeks    Status  New    Target Date  02/09/17            Plan - 01/08/17 1012    Clinical Impression Statement  Patient demonstrates poor motor control with exercise and requires tactile and verbal cueing to correct. Patient demonstrates improved coordination after cueing but requires cueing for proper candence and form. Patient will benefit from further skilled therapy to return to prior level of function.      Rehab Potential  Good    PT Frequency  2x / week    PT Duration  6 weeks    PT Treatment/Interventions  Gait training;Cryotherapy;Therapeutic exercise;Neuromuscular re-education;Patient/family education;Manual techniques;Dry needling;Electrical Stimulation;Iontophoresis 4mg /ml Dexamethasone;Moist Heat;Stair training;Therapeutic activities;Balance training    PT Next Visit Plan  Progress strengthening     PT Home Exercise Plan   See education     Consulted and Agree with Plan of Care  Patient       Patient will benefit from skilled therapeutic intervention in order to improve the following deficits and impairments:  Pain, Decreased coordination, Decreased mobility, Increased muscle spasms  Visit Diagnosis: Bilateral low back pain without sciatica, unspecified chronicity  Cervicalgia  Other muscle spasm     Problem List Patient Active Problem List   Diagnosis Date Noted  . H/O adenomatous polyp of colon 12/03/2015  . Dysphagia 09/16/2013  . GERD with stricture 09/16/2013  . Anxiety 06/29/2013  . Colon polyps 06/29/2013  . Disc disease, degenerative, lumbar or lumbosacral 06/29/2013  . HTN (hypertension) 06/29/2013  . Hyperglycemia, unspecified 06/29/2013  . Hyperlipidemia, unspecified 06/29/2013  . Kidney stones 06/29/2013  . OA (osteoarthritis) 06/29/2013    Robert GalasWesley Cobey Raineri, PT DPT 01/08/2017, 10:36 AM  [facility] [address] [place], [place], [address] Phone: [phone]   Fax:  [phone]  Name: Robert KnackJohn J Eggebrecht MRN: [id number] Date of Birth: 04/03/1935

## 2017-01-12 ENCOUNTER — Ambulatory Visit: Payer: Medicare Other

## 2017-01-12 DIAGNOSIS — M62838 Other muscle spasm: Secondary | ICD-10-CM

## 2017-01-12 DIAGNOSIS — M545 Low back pain, unspecified: Secondary | ICD-10-CM

## 2017-01-12 DIAGNOSIS — M542 Cervicalgia: Secondary | ICD-10-CM

## 2017-01-12 NOTE — Therapy (Signed)
[place] [facility] [address] [place], [place], [address] Phone: [phone]   Fax:  [phone]  Physical Therapy Treatment  Patient Details  Name: Robert KnackJohn J Alexander MRN: [id number] Date of Birth: [birth year] Referring Provider: Dayton BailiffOBERT Azhar TUMEY PA   Encounter Date: 01/12/2017  PT End of Session - 01/12/17 1052    Visit Number  3    Number of Visits  13    Date for PT Re-Evaluation  02/09/17    Authorization Type  3 / 10 G Code    PT Start Time  1030    PT Stop Time  1115    PT Time Calculation (min)  45 min    Activity Tolerance  Patient tolerated treatment well    Behavior During Therapy  [facility] for tasks assessed/performed       Past Medical History:  Diagnosis Date  . Hypertension     Past Surgical History:  Procedure Laterality Date  . kidney stones      There were no vitals filed for this visit.  Subjective Assessment - 01/12/17 1046    Subjective  Patient reports he mostly been experiencing pain mostly when sitting down for long periods of time.    Pertinent History  History of skin CA, HTN    Limitations  Lifting    Patient Stated Goals  To decrease pain    Currently in Pain?  No/denies    Pain Onset  1 to 4 weeks ago       TREATMENT: Therapeutic Exercise: TRX single leg squats - x 10 TRX double leg squats - x10  Triceps push downs in standing - x 30 15# Low row at OMEGA -  x 20 15# Golf Swing with weight ball - 2 x 20 2kg Single leg Squats with ball pressed against the wall with knee - 2 x 10 B OMEGA leg press - 2 x 20 85#; single leg press x20 45# Hip extension in standing at hip machine - 2 x 15 B 85# Single leg stance on airex an pad - x30sec B  Patient demonstrates increased fatigue at end of session   PT Long Term Goals - 12/29/16 1318      PT LONG TERM GOAL #1   Title  Patient will be independent with HEP to continue benefits of therapy after discharge from PT.     Baseline   Dependent with technique/form    Time  6    Period  Weeks    Status  New    Target Date  02/09/17      PT LONG TERM GOAL #2   Title  Patient will decrease MODI to under 6% to indicate functional improvement in lumbar function and decrease in pain with functional activity    Time  6    Period  Weeks    Status  New    Target Date  02/09/17      PT LONG TERM GOAL #3   Title  Patient will decreased NDI to 0% to demonstrate decreased pain with functional activities such as playing golf.    Baseline  8 % NDI    Time  6    Period  Weeks    Status  New    Target Date  02/09/17      PT LONG TERM GOAL #4   Title  Patient will have a worst pain of 1/10 or less to indicate singificant improvement in lumbar functioning.  Baseline  4/10 worst pain    Time  6    Period  Weeks    Status  New    Target Date  02/09/17            Plan - 01/12/17 1111    Clinical Impression Statement  Patient demonstrates poor motor control and continues to require tactile cueing on postural positioning during treatment session. Patient demonstrates improvement in hip hinging ability today versus previous session and patinet will benefit from further skileld therapy to return to prior level of function.     Rehab Potential  Good    PT Frequency  2x / week    PT Duration  6 weeks    PT Treatment/Interventions  Gait training;Cryotherapy;Therapeutic exercise;Neuromuscular re-education;Patient/family education;Manual techniques;Dry needling;Electrical Stimulation;Iontophoresis 4mg /ml Dexamethasone;Moist Heat;Stair training;Therapeutic activities;Balance training    PT Next Visit Plan  Progress strengthening     PT Home Exercise Plan  See education     Consulted and Agree with Plan of Care  Patient       Patient will benefit from skilled therapeutic intervention in order to improve the following deficits and impairments:  Pain, Decreased coordination, Decreased mobility, Increased muscle spasms  Visit  Diagnosis: Bilateral low back pain without sciatica, unspecified chronicity  Cervicalgia  Other muscle spasm     Problem List Patient Active Problem List   Diagnosis Date Noted  . H/O adenomatous polyp of colon 12/03/2015  . Dysphagia 09/16/2013  . GERD with stricture 09/16/2013  . Anxiety 06/29/2013  . Colon polyps 06/29/2013  . Disc disease, degenerative, lumbar or lumbosacral 06/29/2013  . HTN (hypertension) 06/29/2013  . Hyperglycemia, unspecified 06/29/2013  . Hyperlipidemia, unspecified 06/29/2013  . Kidney stones 06/29/2013  . OA (osteoarthritis) 06/29/2013    Myrene GalasWesley Chosen Garron, PT DPT 01/12/2017, 11:19 AM  [place] [facility] [address] [place], [place], [address] Phone: [phone]   Fax:  [phone]  Name: Robert KnackJohn J Alexander MRN: [id number] Date of Birth: Mar 17, 1935

## 2017-01-15 ENCOUNTER — Ambulatory Visit: Payer: Medicare Other

## 2017-01-15 DIAGNOSIS — M545 Low back pain, unspecified: Secondary | ICD-10-CM

## 2017-01-15 DIAGNOSIS — M62838 Other muscle spasm: Secondary | ICD-10-CM

## 2017-01-15 DIAGNOSIS — M542 Cervicalgia: Secondary | ICD-10-CM

## 2017-01-15 NOTE — Therapy (Signed)
[facility] [address] [place], [place], [address] Phone: [phone]   Fax:  [phone]  Physical Therapy Treatment  Patient Details  Name: Robert KnackJohn J Groleau MRN: [id number] Date of Birth: 31-Aug-1935 Referring Provider: Dayton BailiffOBERT Delmer TUMEY PA   Encounter Date: 01/15/2017  PT End of Session - 01/15/17 1002    Visit Number  4    Number of Visits  13    Date for PT Re-Evaluation  02/09/17    Authorization Type  4 / 10 G Code    PT Start Time  0947    PT Stop Time  1030    PT Time Calculation (min)  43 min    Activity Tolerance  Patient tolerated treatment well    Behavior During Therapy  [facility] for tasks assessed/performed       Past Medical History:  Diagnosis Date  . Hypertension     Past Surgical History:  Procedure Laterality Date  . kidney stones      There were no vitals filed for this visit.  Subjective Assessment - 01/15/17 0955    Subjective  Patient reports he performed a lot of activity yesterday which aggravated his back.     Pertinent History  History of skin CA, HTN    Limitations  Lifting    Patient Stated Goals  To decrease pain    Currently in Pain?  No/denies    Pain Onset  1 to 4 weeks ago       TREATMENT: Therapeutic Exercise: Dead bug in hooklying - x 15 Bridges in hooklying - x 20 with 25# weight Thoracic crunches - x 15  SIdelying hip abduction - x 20  Squats on Total Gym - x 20 level 26; single leg throughout decreased AROM - x 20 B Single leg heel taps off of 3" step - x25  Hip extension in standing at hip machine - 2 x 15 B 100# OMEGA leg press -  x 40 75#;   Patient demonstrates increased fatigue at end of session   PT Education - 01/15/17 1001    Education provided  Yes    Education Details  form/technique with exercise    Person(s) Educated  Patient    Methods  Explanation;Demonstration    Comprehension  Verbalized understanding;Returned demonstration           PT Long Term Goals - 12/29/16 1318      PT LONG TERM GOAL #1   Title  Patient will be independent with HEP to continue benefits of therapy after discharge from PT.     Baseline  Dependent with technique/form    Time  6    Period  Weeks    Status  New    Target Date  02/09/17      PT LONG TERM GOAL #2   Title  Patient will decrease MODI to under 6% to indicate functional improvement in lumbar function and decrease in pain with functional activity    Time  6    Period  Weeks    Status  New    Target Date  02/09/17      PT LONG TERM GOAL #3   Title  Patient will decreased NDI to 0% to demonstrate decreased pain with functional activities such as playing golf.    Baseline  8 % NDI    Time  6    Period  Weeks    Status  New    Target  Date  02/09/17      PT LONG TERM GOAL #4   Title  Patient will have a worst pain of 1/10 or less to indicate singificant improvement in lumbar functioning.    Baseline  4/10 worst pain    Time  6    Period  Weeks    Status  New    Target Date  02/09/17            Plan - 01/15/17 1009    Clinical Impression Statement  Patinet demonstratres poor motor control which is improved with tactle and verbal cueing. Patient deomnstrates forward knee translation with squatting exercises requiring cueing for improving hip positioning and will benefit from further skilled therapy to return to prior level of function.     Rehab Potential  Good    PT Frequency  2x / week    PT Duration  6 weeks    PT Treatment/Interventions  Gait training;Cryotherapy;Therapeutic exercise;Neuromuscular re-education;Patient/family education;Manual techniques;Dry needling;Electrical Stimulation;Iontophoresis 4mg /ml Dexamethasone;Moist Heat;Stair training;Therapeutic activities;Balance training    PT Next Visit Plan  Progress strengthening     PT Home Exercise Plan  See education     Consulted and Agree with Plan of Care  Patient       Patient will benefit from  skilled therapeutic intervention in order to improve the following deficits and impairments:  Pain, Decreased coordination, Decreased mobility, Increased muscle spasms  Visit Diagnosis: Other muscle spasm  Cervicalgia  Bilateral low back pain without sciatica, unspecified chronicity     Problem List Patient Active Problem List   Diagnosis Date Noted  . H/O adenomatous polyp of colon 12/03/2015  . Dysphagia 09/16/2013  . GERD with stricture 09/16/2013  . Anxiety 06/29/2013  . Colon polyps 06/29/2013  . Disc disease, degenerative, lumbar or lumbosacral 06/29/2013  . HTN (hypertension) 06/29/2013  . Hyperglycemia, unspecified 06/29/2013  . Hyperlipidemia, unspecified 06/29/2013  . Kidney stones 06/29/2013  . OA (osteoarthritis) 06/29/2013    Myrene GalasWesley Makenli Derstine, PT DPT 01/15/2017, 10:31 AM  [facility] [address] [place], [place], [address] Phone: [phone]   Fax:  [phone]  Name: Robert KnackJohn J Westendorf MRN: [id number] Date of Birth: May 02, 1935

## 2017-01-26 ENCOUNTER — Ambulatory Visit: Payer: Medicare Other

## 2017-01-29 ENCOUNTER — Ambulatory Visit: Payer: Medicare Other | Attending: Physician Assistant

## 2017-01-29 DIAGNOSIS — M545 Low back pain, unspecified: Secondary | ICD-10-CM

## 2017-01-29 DIAGNOSIS — M62838 Other muscle spasm: Secondary | ICD-10-CM | POA: Insufficient documentation

## 2017-01-29 DIAGNOSIS — M542 Cervicalgia: Secondary | ICD-10-CM

## 2017-01-29 NOTE — Therapy (Signed)
[facility] [address] [place], [place], [address] Phone: [phone]   Fax:  [phone]  Physical Therapy Treatment  Patient Details  Name: Robert KnackJohn J Alexander MRN: [id number] Date of Birth: Jun 01, 1935 Referring Provider: Dayton BailiffOBERT Presten TUMEY PA   Encounter Date: 01/29/2017  PT End of Session - 01/29/17 1255    Visit Number  5    Number of Visits  13    Date for PT Re-Evaluation  02/09/17    Authorization Type  5 / 10 G Code    PT Start Time  1115    PT Stop Time  1200    PT Time Calculation (min)  45 min    Activity Tolerance  Patient tolerated treatment well    Behavior During Therapy  [facility] for tasks assessed/performed       Past Medical History:  Diagnosis Date  . Hypertension     Past Surgical History:  Procedure Laterality Date  . kidney stones      There were no vitals filed for this visit.  Subjective Assessment - 01/29/17 1124    Subjective  Patient reports he was able to ditch which only mildy aggravated his back pain. Patient reports he continues to perform his exercises.     Pertinent History  History of skin CA, HTN    Limitations  Lifting    Patient Stated Goals  To decrease pain    Currently in Pain?  No/denies    Pain Onset  1 to 4 weeks ago       TREATMENT: Therapeutic Exercise: Hip extension in standing at hip machine - 2 x 15 B 100# Squats on [employer] - x 20 level 26; single leg throughout decreased AROM - x 20 B Hip abduction at hip machine - x 30 55# Low row at OMEGA - x 40 20#  OMEGA leg press -  x 40 75#; Upward chops with black band - x 30 B Downward chops with black band - x30 B Jumps at total gym - 2 x 15 Single leg stance at side of treadmill - x60sec Golf swings - x 20 B Deadlifts with 40# KB - 2 x 20  Patient demonstrates increased fatigue at end of the session   PT Education - 01/29/17 1141    Education provided  Yes    Education Details  form/technique with  exercise    Person(s) Educated  Patient    Methods  Explanation;Demonstration    Comprehension  Verbalized understanding;Returned demonstration          PT Long Term Goals - 12/29/16 1318      PT LONG TERM GOAL #1   Title  Patient will be independent with HEP to continue benefits of therapy after discharge from PT.     Baseline  Dependent with technique/form    Time  6    Period  Weeks    Status  New    Target Date  02/09/17      PT LONG TERM GOAL #2   Title  Patient will decrease MODI to under 6% to indicate functional improvement in lumbar function and decrease in pain with functional activity    Time  6    Period  Weeks    Status  New    Target Date  02/09/17      PT LONG TERM GOAL #3   Title  Patient will decreased NDI to 0% to demonstrate decreased pain with functional  activities such as playing golf.    Baseline  8 % NDI    Time  6    Period  Weeks    Status  New    Target Date  02/09/17      PT LONG TERM GOAL #4   Title  Patient will have a worst pain of 1/10 or less to indicate singificant improvement in lumbar functioning.    Baseline  4/10 worst pain    Time  6    Period  Weeks    Status  New    Target Date  02/09/17            Plan - 01/29/17 1255    Clinical Impression Statement  Patient demonstrates improvement with lifting exercises today with improvement in deadlift posture and improvement in muscular activation. Patient demonstrates decreased coordination and continues to require cueing to perform exercise with correct form. Patient will benefit from further skilled therapy to return to prior level of function.     Rehab Potential  Good    PT Frequency  2x / week    PT Duration  6 weeks    PT Treatment/Interventions  Gait training;Cryotherapy;Therapeutic exercise;Neuromuscular re-education;Patient/family education;Manual techniques;Dry needling;Electrical Stimulation;Iontophoresis 4mg /ml Dexamethasone;Moist Heat;Stair training;Therapeutic  activities;Balance training    PT Next Visit Plan  Progress strengthening     PT Home Exercise Plan  See education     Consulted and Agree with Plan of Care  Patient       Patient will benefit from skilled therapeutic intervention in order to improve the following deficits and impairments:  Pain, Decreased coordination, Decreased mobility, Increased muscle spasms  Visit Diagnosis: Other muscle spasm  Bilateral low back pain without sciatica, unspecified chronicity  Cervicalgia     Problem List Patient Active Problem List   Diagnosis Date Noted  . H/O adenomatous polyp of colon 12/03/2015  . Dysphagia 09/16/2013  . GERD with stricture 09/16/2013  . Anxiety 06/29/2013  . Colon polyps 06/29/2013  . Disc disease, degenerative, lumbar or lumbosacral 06/29/2013  . HTN (hypertension) 06/29/2013  . Hyperglycemia, unspecified 06/29/2013  . Hyperlipidemia, unspecified 06/29/2013  . Kidney stones 06/29/2013  . OA (osteoarthritis) 06/29/2013    [name], PT DPT 01/29/2017, 1:02 PM  [facility] PHYSICAL AND SPORTS MEDICINE [address] [place], [address] Phone: [phone]   Fax:  [phone]  Name: Robert Alexander MRN: [id number] Date of Birth: 03-04-1935

## 2017-02-02 ENCOUNTER — Ambulatory Visit: Payer: Medicare Other

## 2017-02-02 DIAGNOSIS — M545 Low back pain, unspecified: Secondary | ICD-10-CM

## 2017-02-02 DIAGNOSIS — M62838 Other muscle spasm: Secondary | ICD-10-CM | POA: Diagnosis not present

## 2017-02-02 DIAGNOSIS — M542 Cervicalgia: Secondary | ICD-10-CM

## 2017-02-02 NOTE — Therapy (Signed)
Robert Alexander, Robert Alexander, Robert Alexander  Physical Therapy Treatment  Patient Details  Name: Robert Alexander MRN: [id number] Date of Birth: Jun 04, 1935 Referring Provider: Dayton BailiffOBERT Alexander TUMEY PA   Encounter Date: 02/02/2017  PT End of Session - 02/02/17 1236    Visit Number  6    Number of Visits  13    Date for PT Re-Evaluation  02/09/17    Authorization Type  6 / 10 G Code    PT Start Time  1115    PT Stop Time  1200    PT Time Calculation (min)  45 min    Activity Tolerance  Patient tolerated treatment well    Behavior During Therapy  [facility] for tasks assessed/performed       Past Medical History:  Diagnosis Date  . Hypertension     Past Surgical History:  Procedure Laterality Date  . kidney stones      There were no vitals filed for this visit.  Subjective Assessment - 02/02/17 1234    Subjective  Patient reports improvement in symptoms and wants to take a break in therapy to see how his back responds without treatment.     Pertinent History  History of skin CA, HTN    Limitations  Lifting    Patient Stated Goals  To decrease pain    Currently in Pain?  No/denies    Pain Onset  1 to 4 weeks ago       TREATMENT:  Therapeutic Exercise: Deadlifts with 40# KB - 2 x 20  Low row at OMEGA - x 40 20#  Striaight arm push downs at [employer] - x 20 20# Monster walks with GTB around knees - 2 x 730ft Golf swings - x 20 B with 1/2lb at end of shaft Hip extension at hip machine - x 20 115#  Hip abduction at hip machine - x 20 100#  [employer] - x22400ft with 40# unilateral carry Jumps at total gym - 2 x 15 Single leg stance at side of treadmill - x60sec  Patient demonstrates increased fatigue at end of the session   PT Education - 02/02/17 1235    Education provided  Yes    Education Details  Educated to continue HEP    Person(s) Educated  Patient    Methods  Explanation;Demonstration    Comprehension  Verbalized understanding;Returned demonstration          PT Long Term Goals - 12/29/16 1318      PT LONG TERM GOAL #1   Title  Patient will be independent with HEP to continue benefits of therapy after discharge from PT.     Baseline  Dependent with technique/form    Time  6    Period  Weeks    Status  New    Target Date  02/09/17      PT LONG TERM GOAL #2   Title  Patient will decrease MODI to under 6% to indicate functional improvement in lumbar function and decrease in pain with functional activity    Time  6    Period  Weeks    Status  New    Target Date  02/09/17      PT LONG TERM GOAL #3   Title  Patient will decreased NDI to 0% to demonstrate decreased pain with functional activities such as playing golf.    Baseline  8 % NDI    Time  6    Period  Weeks    Status  New    Target Date  02/09/17      PT LONG TERM GOAL #4   Title  Patient will have a worst pain of 1/10 or less to indicate singificant improvement in lumbar functioning.    Baseline  4/10 worst pain    Time  6    Period  Weeks    Status  New    Target Date  02/09/17            Plan - 02/02/17 1236    Clinical Impression Statement  Patient demonstrates improvement in ability to perform exercises. Patient requires less cueing for exercise performance indicating functional carryover between session and improvement of motor control. Patient will benefit from further skilled therapy to continue benefits of therapy after discharge.     Rehab Potential  Good    PT Frequency  2x / week    PT Duration  6 weeks    PT Treatment/Interventions  Gait training;Cryotherapy;Therapeutic exercise;Neuromuscular re-education;Patient/family education;Manual techniques;Dry needling;Electrical Stimulation;Iontophoresis 4mg /ml Dexamethasone;Moist Heat;Stair training;Therapeutic activities;Balance training    PT Next Visit Plan  Progress strengthening     PT Home  Exercise Plan  See education     Consulted and Agree with Plan of Care  Patient       Patient will benefit from skilled therapeutic intervention in order to improve the following deficits and impairments:  Pain, Decreased coordination, Decreased mobility, Increased muscle spasms  Visit Diagnosis: Other muscle spasm  Bilateral low back pain without sciatica, unspecified chronicity  Cervicalgia     Problem List Patient Active Problem List   Diagnosis Date Noted  . H/O adenomatous polyp of colon 12/03/2015  . Dysphagia 09/16/2013  . GERD with stricture 09/16/2013  . Anxiety 06/29/2013  . Colon polyps 06/29/2013  . Disc disease, degenerative, lumbar or lumbosacral 06/29/2013  . HTN (hypertension) 06/29/2013  . Hyperglycemia, unspecified 06/29/2013  . Hyperlipidemia, unspecified 06/29/2013  . Kidney stones 06/29/2013  . OA (osteoarthritis) 06/29/2013    [name], PT DPT 02/02/2017, 12:45 PM  [place] [facility] PHYSICAL AND SPORTS MEDICINE Robert Alexander Phone: [phone]   Fax:  [phone]  Name: Robert Alexander MRN: [id number] Date of Birth: Nov 11, 1935

## 2017-02-05 ENCOUNTER — Ambulatory Visit: Payer: Medicare Other

## 2017-04-13 ENCOUNTER — Ambulatory Visit (INDEPENDENT_AMBULATORY_CARE_PROVIDER_SITE_OTHER): Payer: Medicare Other | Admitting: Podiatry

## 2017-04-13 ENCOUNTER — Encounter: Payer: Self-pay | Admitting: Podiatry

## 2017-04-13 DIAGNOSIS — G588 Other specified mononeuropathies: Secondary | ICD-10-CM

## 2017-04-13 DIAGNOSIS — M779 Enthesopathy, unspecified: Secondary | ICD-10-CM

## 2017-04-13 DIAGNOSIS — M778 Other enthesopathies, not elsewhere classified: Secondary | ICD-10-CM

## 2017-04-13 DIAGNOSIS — G5760 Lesion of plantar nerve, unspecified lower limb: Secondary | ICD-10-CM | POA: Diagnosis not present

## 2017-04-13 DIAGNOSIS — M7750 Other enthesopathy of unspecified foot: Secondary | ICD-10-CM | POA: Diagnosis not present

## 2017-04-13 MED ORDER — MELOXICAM 7.5 MG PO TABS: 7.5000 mg | ORAL_TABLET | Freq: Every day | ORAL | 0 refills | Status: DC

## 2017-04-13 MED ORDER — NONFORMULARY OR COMPOUNDED ITEM: 2 refills | Status: DC

## 2017-04-13 NOTE — Progress Notes (Signed)
This patient presents the office with chief complaint of severe pain noted in the forefoot of both feet.  Patient states that Dr. Al CorpusHyatt had evaluated and treated this patient and are early  2018.  This patient states that he treated him with injections of dehydrated alcohol.  He also says that he was seen Dr. Elijah Birkom who provided him additional injections, which cause more pain than relief.  He presents the office today stating that he is in severe pain and discomfort and desires to remain active.[name].  He says that he is busy taking care of horse and playing golf.   [name].  His wife presents to the office with this patient.  He says he does not want an additional injection in either foot.  He says he has severe pain and discomfort because any of the treatments were unsuccessful previously.  He presents the office today for continued evaluation and treatment of his painful. Feet  . He does admit that he has purchased a rocker-bottom shoes which have mildly helped but pain persists.     General Appearance  Alert, conversant and in no acute stress.  Vascular  Dorsalis pedis and posterior tibial  pulses are palpable  bilaterally.  Capillary return is within normal limits  bilaterally. Temperature is within normal limits  bilaterally.  Neurologic  Senn-Weinstein monofilament wire test within normal limits  bilaterally. Muscle power within normal limits bilaterally.  Nails normal nails with no evidence of fungal or bacterial infection.  Orthopedic  No limitations of motion of motion feet .  No crepitus or effusions noted.  No bony pathology or digital deformities noted. Palpable pain in the second and third interspaces, as well as plantar Palpable pain at the heads of the second, third and fourth metatarsals both feet.  No evidence of any increased temperature or inflammation or increased temperature noted.  Skin  normotropic skin with no porokeratosis noted bilaterally.  No signs of infections or ulcers noted.     Neuroma B/L  Capsulitis 2,3,4  B/L  ROV.  Discuss the pain with this patient.  We decided to prescribed pain medicine from [facility]  to help him sleep better at night. Patient was also prescribed Mobic 7.5 mg tablets to take one daily.  Patient was also dispensed power step insoles and given metatarsal pads that he could add to the power steps if needed.  Patient is to return to the office in 3 weeks for further evaluation and treatment. RTC 3 weeks   Helane GuntherGregory Jobie Popp DPM

## 2017-04-13 NOTE — Addendum Note (Signed)
Addended byMaury Dus: Roemello Speyer L on: 04/13/2017 04:58 PM   Modules accepted: Orders

## 2017-04-27 ENCOUNTER — Ambulatory Visit: Payer: Medicare Other | Admitting: Podiatry

## 2017-04-27 ENCOUNTER — Ambulatory Visit (INDEPENDENT_AMBULATORY_CARE_PROVIDER_SITE_OTHER): Payer: Medicare Other | Admitting: Podiatry

## 2017-04-27 ENCOUNTER — Encounter: Payer: Self-pay | Admitting: Podiatry

## 2017-04-27 DIAGNOSIS — G629 Polyneuropathy, unspecified: Secondary | ICD-10-CM | POA: Diagnosis not present

## 2017-04-27 MED ORDER — GABAPENTIN 100 MG PO CAPS: ORAL_CAPSULE | ORAL | 1 refills | Status: DC

## 2017-04-27 NOTE — Progress Notes (Signed)
He presents today with chronic pain forefoot bilateral.  He states that he does not want any more shots.  He states that he has been using a nerve cream which seems to help some.  Objective: Vital signs are stable he is alert and oriented x3 no change in physical exam.  Pulses are palpable decreased sensorium per Semmes Weinstein monofilament.  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  Plan: Started him on gabapentin 100 mg twice a day I will follow-up with him in 1 month to increase the dosage.

## 2017-05-10 ENCOUNTER — Other Ambulatory Visit: Payer: Self-pay | Admitting: Podiatry

## 2017-05-25 ENCOUNTER — Encounter: Payer: Self-pay | Admitting: Podiatry

## 2017-05-25 ENCOUNTER — Ambulatory Visit (INDEPENDENT_AMBULATORY_CARE_PROVIDER_SITE_OTHER): Payer: Medicare Other | Admitting: Podiatry

## 2017-05-25 DIAGNOSIS — G629 Polyneuropathy, unspecified: Secondary | ICD-10-CM | POA: Diagnosis not present

## 2017-05-25 MED ORDER — GABAPENTIN 100 MG PO CAPS: ORAL_CAPSULE | ORAL | 3 refills | Status: DC

## 2017-05-25 NOTE — Progress Notes (Signed)
He presents today for follow-up of his neuropathy bilateral lower extremity.  He is also seeing a neurologist at [facility].  He states that after taking the gabapentin he is approximately 40% improved.  Objective: Vital signs are stable he is alert and oriented x3.  Pulses are palpable.  No change in neurologic symptoms.  No open lesions or wounds.  Assessment: Idiopathic neuropathy bilateral.  Plan: Increase his gabapentin 200 mg once in the morning and once at night.  Follow-up with him in 1 month she had questions or concerns regarding the medication he will notify us to medically.  I encouraged him to continue to follow-up with his neurologist.

## 2017-07-01 ENCOUNTER — Encounter: Payer: Self-pay | Admitting: Podiatry

## 2017-07-01 ENCOUNTER — Ambulatory Visit (INDEPENDENT_AMBULATORY_CARE_PROVIDER_SITE_OTHER): Payer: Medicare Other | Admitting: Podiatry

## 2017-07-01 DIAGNOSIS — L03032 Cellulitis of left toe: Secondary | ICD-10-CM | POA: Diagnosis not present

## 2017-07-01 DIAGNOSIS — L02612 Cutaneous abscess of left foot: Secondary | ICD-10-CM | POA: Diagnosis not present

## 2017-07-01 MED ORDER — DOXYCYCLINE HYCLATE 100 MG PO TABS: 100.0000 mg | ORAL_TABLET | Freq: Two times a day (BID) | ORAL | 0 refills | Status: DC

## 2017-07-01 NOTE — Progress Notes (Signed)
He presents today with chief complaint of blisters between the toes #2 and 3 and 3 and 4 of the left foot.  He states that they were painful at night before but have gone down and not as painful nail.  Objective: Vital signs are stable alert and oriented x3 there is no erythema edema saline strange odor small bullae between the toes.  These were lanced today allowed to drain.  There is mild erythema surrounding them.  Assessment: Mild erythema surrounding to bulla second and third interdigital spaces.  Plan: Start him on doxycycline Epsom salts and warm water soaks I will up with me in a couple of weeks if necessary.

## 2017-07-06 ENCOUNTER — Ambulatory Visit: Payer: Medicare Other | Admitting: Podiatry

## 2017-07-15 ENCOUNTER — Telehealth: Payer: Self-pay | Admitting: Podiatry

## 2017-07-15 NOTE — Telephone Encounter (Signed)
Patient called to schedule appointment after dropping dresser drawer on his toe. Patient said his toe is bleeding and throbbing. [facility] and she suggested pt go to Urgent Care, since we were unable to see patient today. Patient offered opening with Dr. Stacie AcresMayer on 6/20 but he declined.

## 2017-07-16 ENCOUNTER — Ambulatory Visit (INDEPENDENT_AMBULATORY_CARE_PROVIDER_SITE_OTHER): Payer: Medicare Other | Admitting: Podiatry

## 2017-07-16 ENCOUNTER — Telehealth: Payer: Self-pay | Admitting: Podiatry

## 2017-07-16 ENCOUNTER — Ambulatory Visit: Payer: Medicare Other | Admitting: Podiatry

## 2017-07-16 ENCOUNTER — Ambulatory Visit (INDEPENDENT_AMBULATORY_CARE_PROVIDER_SITE_OTHER): Payer: Medicare Other

## 2017-07-16 ENCOUNTER — Encounter: Payer: Self-pay | Admitting: Podiatry

## 2017-07-16 DIAGNOSIS — R238 Other skin changes: Secondary | ICD-10-CM | POA: Diagnosis not present

## 2017-07-16 DIAGNOSIS — S99922A Unspecified injury of left foot, initial encounter: Secondary | ICD-10-CM

## 2017-07-16 MED ORDER — DOXYCYCLINE HYCLATE 100 MG PO TABS: 100.0000 mg | ORAL_TABLET | Freq: Two times a day (BID) | ORAL | 0 refills | Status: DC

## 2017-07-16 NOTE — Telephone Encounter (Signed)
Antibiotic has been sent to pharmacy and patient has been notifie

## 2017-07-16 NOTE — Telephone Encounter (Signed)
Pt . Needs to get antibiotic Rx sent to [facility] on [address] (he thought he had one) does not and needs to have one sent over to pick up.

## 2017-07-16 NOTE — Progress Notes (Signed)
This patient presents the office with chief complaint of a severely filled blister on the big toe left foot.  He says he dropped a drawer on his toe and it formed a fluid filled, left big toe.  [facility] and was told to apply ice to the blister site.  He says x-rays were taken revealing no evidence of any breaks in the bone of the big toe left foot.  He says he was also prescribed antibiotics to be taken by mouth.  He presents the office today stating he had difficulty sleeping due to the pain that he was experiencing in the left big toe.  He presents the office today for an evaluation and treatment of this fluid filled blister.  General Appearance  Alert, conversant and in no acute stress.  Vascular  Dorsalis pedis and posterior tibial  pulses are palpable  bilaterally.  Capillary return is within normal limits  bilaterally. Temperature is within normal limits  bilaterally.  Neurologic  Senn-Weinstein monofilament wire test within normal limits  bilaterally. Muscle power within normal limits bilaterally.  Nails Thick disfigured discolored nails with subungual debris  from hallux to fifth toes bilaterally. No evidence of bacterial infection or drainage bilaterally.  Orthopedic  No limitations of motion of motion feet .  No crepitus or effusions noted.  No bony pathology or digital deformities noted.  Skin  normotropic skin with no porokeratosis noted bilaterally.  There is a large blister formation noted on the dorsal lateral and distal aspect of the left hallux.  There is a purplish discoloration noted to the toe except at the plantar aspect of the IPJ left hallux.    Bulla/Blister  left hallux.  ROV  . Incision and drainage of the blister with careful attention to maintain the biological covering of the left hallux. The blister site was incised and drained of the fluid.  Silvadene and a compression dressing was applied to the left hallux.  Patient was told that he  can soak his foot in Epsom salts at home but needs to really apply the compression dressing.  Patient called back and said [facility]  Patient was prescribed doxycycline to be taken for 1 week. Patient was told to return to the office in one week  for further evaluation and treatment.  Patient was told to monitor the purplish coloration in the toe over the weekend.     Helane GuntherGregory Keigan Girten DPM

## 2017-07-20 ENCOUNTER — Ambulatory Visit (INDEPENDENT_AMBULATORY_CARE_PROVIDER_SITE_OTHER): Payer: Self-pay | Admitting: Podiatry

## 2017-07-20 ENCOUNTER — Encounter: Payer: Self-pay | Admitting: Podiatry

## 2017-07-20 DIAGNOSIS — S99922A Unspecified injury of left foot, initial encounter: Secondary | ICD-10-CM

## 2017-07-20 DIAGNOSIS — R238 Other skin changes: Secondary | ICD-10-CM

## 2017-07-20 NOTE — Progress Notes (Signed)
His patient returns to the office follow-up for diagnosis of a painful ball/blister, left hallux.  He previously had dropped a drawer on his toe and developed a severe blister. He was [facility] by [facility] and treated.  He presents to the office the next day where I performed an incision and drainage of the blister, left hallux.  Patient was told to soak his foot and re  apply a compression dressing.  Patient was also given a prescription of doxycycline.  He presents the office today stating that his toe is better but there is still drainage coming from the blister proximal to the nail left big toe. No evidence of any infection noted.     General Appearance  Alert, conversant and in no acute stress.  Vascular  Dorsalis pedis and posterior tibial  pulses are palpable  bilaterally.  Capillary return is within normal limits  bilaterally. Temperature is within normal limits  bilaterally.  Neurologic  Senn-Weinstein monofilament wire test within normal limits  bilaterally. Muscle power within normal limits bilaterally.  Nails Thick disfigured discolored nails with subungual debris  from hallux to fifth toes bilaterally. No evidence of bacterial infection or drainage bilaterally.  Orthopedic  No limitations of motion of motion feet .  No crepitus or effusions noted.  No bony pathology or digital deformities noted.  Skin  normotropic skin except left hallux  with no porokeratosis noted bilaterally. There continues to be a blister at the distal aspect of the left hallux with fluctuance.  There is a healing blister proximal to the left hallux toenail with scant drainage.  This blister is still open and draining.  There is normal appearing skin noted at the distal aspect of the left hallux.   Blister left hallux  ROV  Examination of the blister was performed .  The blister is open and draining, but has improved from his initial visit last week.  No infection has formed since the initial injury.  He was told to  continue to soak and bandage his toe and to return to the office in 10 days for further evaluation and treatment.  He was told to return to the office sooner if problems persist.   Helane GuntherGregory Kasiah Manka DPM

## 2017-08-03 ENCOUNTER — Encounter: Payer: Self-pay | Admitting: Podiatry

## 2017-08-03 ENCOUNTER — Ambulatory Visit (INDEPENDENT_AMBULATORY_CARE_PROVIDER_SITE_OTHER): Payer: Medicare Other | Admitting: Podiatry

## 2017-08-03 DIAGNOSIS — G629 Polyneuropathy, unspecified: Secondary | ICD-10-CM | POA: Diagnosis not present

## 2017-08-03 DIAGNOSIS — R238 Other skin changes: Secondary | ICD-10-CM

## 2017-08-03 NOTE — Progress Notes (Signed)
His patient returns to the office follow-up for diagnosis of a painful ball/blister, left hallux.  He previously had dropped a drawer on his toe and developed a severe blister. [name]. He presents to the office today not experiencing any pain.  His left big toe has dark healing blistery skin in the absence of drainage.  He presents for evaluation and treatment.    General Appearance  Alert, conversant and in no acute stress.  Vascular  Dorsalis pedis and posterior tibial  pulses are palpable  bilaterally.  Capillary return is within normal limits  bilaterally. Temperature is within normal limits  bilaterally.  Neurologic  Senn-Weinstein monofilament wire test within normal limits  bilaterally. Muscle power within normal limits bilaterally.  Nails Thick disfigured discolored nails with subungual debris  from hallux to fifth toes bilaterally. No evidence of bacterial infection or drainage bilaterally.  Orthopedic  No limitations of motion of motion feet .  No crepitus or effusions noted.  No bony pathology or digital deformities noted.  Skin  normotropic skin except left hallux  with no porokeratosis noted bilaterally. There continues to be a dark skin  at the distal aspect of the left hallux .  There is a healing blister proximal to the left hallux toenail with no  drainage.  T  There is normal appearing skin noted at the distal aspect of the left hallux.   Blister left hallux  ROV  Examination of the blister was performed .  The blister  has improved from his initial visit last week.  No infection has formed since the initial injury.  He was told to discontinue  Soaks  and bandaging to  his toe. RTC prn   He was told to return to the office sooner if problems persist.   Helane GuntherGregory Aiden Rao DPM

## 2017-09-23 ENCOUNTER — Ambulatory Visit (INDEPENDENT_AMBULATORY_CARE_PROVIDER_SITE_OTHER): Payer: Medicare Other | Admitting: Podiatry

## 2017-09-23 ENCOUNTER — Ambulatory Visit: Payer: Medicare Other

## 2017-09-23 ENCOUNTER — Encounter: Payer: Self-pay | Admitting: Podiatry

## 2017-09-23 DIAGNOSIS — S90112A Contusion of left great toe without damage to nail, initial encounter: Secondary | ICD-10-CM | POA: Diagnosis not present

## 2017-09-23 NOTE — Progress Notes (Signed)
He presents today concerned that he may have a couple of ingrown toenails he just wants me to look at them make sure that they are not getting ingrown and clipped them if possible.  Objective: Vital signs are stable alert and oriented x3 there is no erythema edema sialitis drainage or odor hallux nails are thick and appear to be nice and rectus I do not see any sites of it signs of ingrowing toenails.  Assessment: Normal exam.  Plan: Follow-up with him as needed.

## 2017-10-19 ENCOUNTER — Ambulatory Visit: Payer: Medicare Other | Admitting: Podiatry

## 2017-12-11 ENCOUNTER — Encounter: Payer: Self-pay | Admitting: *Deleted

## 2017-12-14 ENCOUNTER — Ambulatory Visit: Payer: Medicare Other | Admitting: Anesthesiology

## 2017-12-14 ENCOUNTER — Encounter
Admission: RE | Disposition: A | Discharge status: Home / Self Care | Payer: Self-pay | Source: Ambulatory Visit | Attending: Unknown Physician Specialty

## 2017-12-14 ENCOUNTER — Ambulatory Visit
Admission: RE | Admit: 2017-12-14 | Discharge: 2017-12-14 | Disposition: A | Discharge status: Home / Self Care | Payer: Medicare Other | Source: Ambulatory Visit | Attending: Unknown Physician Specialty | Admitting: Unknown Physician Specialty

## 2017-12-14 DIAGNOSIS — Z791 Long term (current) use of non-steroidal anti-inflammatories (NSAID): Secondary | ICD-10-CM | POA: Insufficient documentation

## 2017-12-14 DIAGNOSIS — Z87442 Personal history of urinary calculi: Secondary | ICD-10-CM | POA: Diagnosis not present

## 2017-12-14 DIAGNOSIS — I10 Essential (primary) hypertension: Secondary | ICD-10-CM | POA: Insufficient documentation

## 2017-12-14 DIAGNOSIS — Z79899 Other long term (current) drug therapy: Secondary | ICD-10-CM | POA: Insufficient documentation

## 2017-12-14 DIAGNOSIS — M5136 Other intervertebral disc degeneration, lumbar region: Secondary | ICD-10-CM | POA: Diagnosis not present

## 2017-12-14 DIAGNOSIS — E785 Hyperlipidemia, unspecified: Secondary | ICD-10-CM | POA: Diagnosis not present

## 2017-12-14 DIAGNOSIS — E119 Type 2 diabetes mellitus without complications: Secondary | ICD-10-CM | POA: Diagnosis not present

## 2017-12-14 DIAGNOSIS — N400 Enlarged prostate without lower urinary tract symptoms: Secondary | ICD-10-CM | POA: Insufficient documentation

## 2017-12-14 DIAGNOSIS — K219 Gastro-esophageal reflux disease without esophagitis: Secondary | ICD-10-CM | POA: Insufficient documentation

## 2017-12-14 DIAGNOSIS — F419 Anxiety disorder, unspecified: Secondary | ICD-10-CM | POA: Insufficient documentation

## 2017-12-14 DIAGNOSIS — K222 Esophageal obstruction: Secondary | ICD-10-CM | POA: Diagnosis present

## 2017-12-14 HISTORY — DX: Personal history of urinary calculi: Z87.442

## 2017-12-14 HISTORY — DX: Benign prostatic hyperplasia without lower urinary tract symptoms: N40.0

## 2017-12-14 HISTORY — DX: Type 2 diabetes mellitus without complications: E11.9

## 2017-12-14 HISTORY — DX: Other allergy status, other than to drugs and biological substances: Z91.09

## 2017-12-14 HISTORY — DX: Gastro-esophageal reflux disease without esophagitis: K21.9

## 2017-12-14 HISTORY — DX: Other intervertebral disc degeneration, lumbar region without mention of lumbar back pain or lower extremity pain: M51.369

## 2017-12-14 HISTORY — DX: Anxiety disorder, unspecified: F41.9

## 2017-12-14 HISTORY — DX: Acute recurrent sinusitis, unspecified: J01.91

## 2017-12-14 HISTORY — PX: ESOPHAGOGASTRODUODENOSCOPY (EGD) WITH PROPOFOL: SHX5813

## 2017-12-14 HISTORY — DX: Esophageal obstruction: K22.2

## 2017-12-14 HISTORY — DX: Other intervertebral disc degeneration, lumbar region: M51.36

## 2017-12-14 HISTORY — DX: Personal history of colon polyps, unspecified: Z86.0100

## 2017-12-14 HISTORY — DX: Personal history of colonic polyps: Z86.010

## 2017-12-14 HISTORY — DX: Hyperlipidemia, unspecified: E78.5

## 2017-12-14 SURGERY — ESOPHAGOGASTRODUODENOSCOPY (EGD) WITH PROPOFOL
Anesthesia: General

## 2017-12-14 MED ORDER — PROPOFOL 500 MG/50ML IV EMUL
INTRAVENOUS | Status: DC | PRN
  Administered 2017-12-14: 50 ug/kg/min via INTRAVENOUS

## 2017-12-14 MED ORDER — LIDOCAINE HCL (PF) 1 % IJ SOLN
INTRAMUSCULAR | Status: AC
  Administered 2017-12-14: 0.3 mL
  Filled 2017-12-14: qty 2

## 2017-12-14 MED ORDER — SODIUM CHLORIDE 0.9 % IV SOLN
INTRAVENOUS | Status: DC
  Administered 2017-12-14: 1000 mL via INTRAVENOUS

## 2017-12-14 MED ORDER — LIDOCAINE HCL (PF) 2 % IJ SOLN
INTRAMUSCULAR | Status: AC
  Filled 2017-12-14: qty 10

## 2017-12-14 MED ORDER — LIDOCAINE HCL (PF) 2 % IJ SOLN
INTRAMUSCULAR | Status: DC | PRN
  Administered 2017-12-14: 60 mg

## 2017-12-14 MED ORDER — FENTANYL CITRATE (PF) 100 MCG/2ML IJ SOLN
INTRAMUSCULAR | Status: AC
  Filled 2017-12-14: qty 2

## 2017-12-14 MED ORDER — MIDAZOLAM HCL 2 MG/2ML IJ SOLN
INTRAMUSCULAR | Status: AC
  Filled 2017-12-14: qty 2

## 2017-12-14 MED ORDER — SODIUM CHLORIDE 0.9 % IV SOLN: INTRAVENOUS | Status: DC

## 2017-12-14 MED ORDER — PROPOFOL 10 MG/ML IV BOLUS
INTRAVENOUS | Status: DC | PRN
  Administered 2017-12-14: 30 mg via INTRAVENOUS
  Administered 2017-12-14: 20 mg via INTRAVENOUS

## 2017-12-14 NOTE — H&P (Signed)
Primary Care Physician:  Marguarite Arbour, MD Primary Gastroenterologist:  Dr. Mechele Collin  Pre-Procedure History & Physical: HPI:  MAYFORD ALBERG is a 82 y.o. male is here for an endoscopy.   Past Medical History:  Diagnosis Date  . Acute recurrent sinusitis   . Anxiety   . BPH (benign prostatic hyperplasia)   . DDD (degenerative disc disease), lumbar   . Diabetes mellitus without complication (HCC)   . Environmental allergies   . GERD (gastroesophageal reflux disease)   . History of colonic polyps   . History of kidney stones   . Hyperlipidemia   . Hypertension   . Schatzki's ring     Past Surgical History:  Procedure Laterality Date  . HEMORRHOID SURGERY    . HERNIA REPAIR    . kidney stones    . KNEE SURGERY    . SHOULDER SURGERY      Prior to Admission medications   Medication Sig Start Date End Date Taking? Authorizing Provider  acetaminophen (TYLENOL) 325 MG tablet Take 325 mg by mouth daily.    [provider]  amLODipine (NORVASC) 5 MG tablet TAKE 1 TABLET DAILY 06/30/16   [provider]  celecoxib (CELEBREX) 200 MG capsule TAKE 1 CAPSULE TWICE A DAY AS NEEDED FOR PAIN 06/29/15   [provider]  doxycycline (VIBRA-TABS) 100 MG tablet Take 1 tablet (100 mg total) by mouth 2 (two) times daily. 07/16/17   Helane Gunther, DPM  esomeprazole (NEXIUM) 40 MG capsule Take by mouth. 05/22/15   [provider]  Fluticasone-Salmeterol (ADVAIR DISKUS) 250-50 MCG/DOSE AEPB INHALE 1 INHALATION INTO THE LUNGS EVERY 12 (TWELVE) HOURS. 05/07/15   [provider]  gabapentin (NEURONTIN) 100 MG capsule Take 2 capsules in the morning and 2 capsules at bedtime 05/25/17   Hyatt, Max T, DPM  gentamicin ointment (GARAMYCIN) 0.1 % APPLY TO NOSE THREE TIMES DAILY 02/25/17   [provider]  LORazepam (ATIVAN) 1 MG tablet Take 1 mg by mouth at bedtime.  03/08/13   [provider]  meloxicam (MOBIC) 7.5 MG tablet TAKE 1 TABLET BY MOUTH  EVERY DAY 05/11/17   Helane Gunther, DPM  metaxalone [facility]) 800 MG tablet  01/24/16   [provider]  montelukast (SINGULAIR) 10 MG tablet TAKE 1 TABLET (10 MG TOTAL) BY MOUTH NIGHTLY. 05/07/15   [provider]  NONFORMULARY OR COMPOUNDED ITEM [facility]  Combination Pain Cream -  Baclofen 2%, Doxepin 5%, Gabapentin 6%, Topiramate 2%, Pentoxifylline 3% Apply 1-2 grams to affected area 3-4 times daily Qty. 120 gm 3 refills 04/13/17   Helane Gunther, DPM  omeprazole (PRILOSEC) 40 MG capsule Take 40 mg by mouth daily. 01/22/17   [provider]    Allergies as of 12/08/2017 - Review Complete 09/23/2017  Allergen Reaction Noted  . Penicillins Rash 03/31/2013    History reviewed. No pertinent family history.  Social History   Socioeconomic History  . Marital status: Married    Spouse name: Not on file  . Number of children: Not on file  . Years of education: Not on file  . Highest education level: Not on file  Occupational History  . Not on file  Social Needs  . Financial resource strain: Not on file  . Food insecurity:    Worry: Not on file    Inability: Not on file  . Transportation needs:    Medical: Not on file    Non-medical: Not on file  Tobacco Use  .  Smoking status: Never Smoker  . Smokeless tobacco: Current User    Types: Chew  Substance and Sexual Activity  . Alcohol use: No  . Drug use: No  . Sexual activity: Not on file  Lifestyle  . Physical activity:    Days per week: Not on file    Minutes per session: Not on file  . Stress: Not on file  Relationships  . Social connections:    Talks on phone: Not on file    Gets together: Not on file    Attends religious service: Not on file    Active member of club or organization: Not on file    Attends meetings of clubs or organizations: Not on file    Relationship status: Not on file  . Intimate partner violence:    Fear of current or ex partner: Not on file    Emotionally  abused: Not on file    Physically abused: Not on file    Forced sexual activity: Not on file  Other Topics Concern  . Not on file  Social History Narrative  . Not on file    Review of Systems: See HPI, otherwise negative ROS  Physical Exam: BP (!) 150/91   Pulse 70   Temp (!) 96.7 F (35.9 C) (Tympanic)   Resp 17   Ht 5\' 7"  (1.702 m)   Wt 68 kg   SpO2 100%   BMI 23.48 kg/m  General:   Alert,  pleasant and cooperative in NAD Head:  Normocephalic and atraumatic. Neck:  Supple; no masses or thyromegaly. Lungs:  Clear throughout to auscultation.    Heart:  Regular rate and rhythm. Abdomen:  Soft, nontender and nondistended. Normal bowel sounds, without guarding, and without rebound.   Neurologic:  Alert and  oriented x4;  grossly normal neurologically.  Impression/Plan: Phillis KnackJohn J Pinn is here for an endoscopy to be performed for dysphagia on and off for years.  Risks, benefits, limitations, and alternatives regarding  endoscopy have been reviewed with the patient.  Questions have been answered.  All parties agreeable.   Lynnae PrudeELLIOTT, Gwynevere Lizana, MD  12/14/2017, 11:14 AM

## 2017-12-14 NOTE — Anesthesia Preprocedure Evaluation (Signed)
Anesthesia Evaluation  Patient identified by MRN, date of birth, ID band Patient awake    Reviewed: Allergy & Precautions, NPO status , Patient's Chart, lab work & pertinent test results  History of Anesthesia Complications Negative for: history of anesthetic complications  Airway Mallampati: II  TM Distance: >3 FB Neck ROM: Full    Dental  (+) Poor Dentition   Pulmonary neg pulmonary ROS, neg sleep apnea, neg COPD,    breath sounds clear to auscultation- rhonchi (-) wheezing      Cardiovascular hypertension, Pt. on medications (-) CAD, (-) Past MI, (-) Cardiac Stents and (-) CABG  Rhythm:Regular Rate:Normal - Systolic murmurs and - Diastolic murmurs    Neuro/Psych Anxiety negative neurological ROS     GI/Hepatic Neg liver ROS, GERD  ,  Endo/Other  diabetes  Renal/GU Renal disease: hx of nephrolithiasis.     Musculoskeletal  (+) Arthritis ,   Abdominal (+) - obese,   Peds  Hematology negative hematology ROS (+)   Anesthesia Other Findings Past Medical History: No date: Acute recurrent sinusitis No date: Anxiety No date: BPH (benign prostatic hyperplasia) No date: DDD (degenerative disc disease), lumbar No date: Diabetes mellitus without complication (HCC) No date: Environmental allergies No date: GERD (gastroesophageal reflux disease) No date: History of colonic polyps No date: History of kidney stones No date: Hyperlipidemia No date: Hypertension No date: Schatzki's ring   Reproductive/Obstetrics                             Anesthesia Physical Anesthesia Plan  ASA: II  Anesthesia Plan: General   Post-op Pain Management:    Induction: Intravenous  PONV Risk Score and Plan: 1 and Propofol infusion  Airway Management Planned: Natural Airway  Additional Equipment:   Intra-op Plan:   Post-operative Plan:   Informed Consent: I have reviewed the patients History and  Physical, chart, labs and discussed the procedure including the risks, benefits and alternatives for the proposed anesthesia with the patient or authorized representative who has indicated his/her understanding and acceptance.   Dental advisory given  Plan Discussed with: CRNA and Anesthesiologist  Anesthesia Plan Comments:         Anesthesia Quick Evaluation

## 2017-12-14 NOTE — Transfer of Care (Signed)
Immediate Anesthesia Transfer of Care Note  Patient: Robert KnackJohn J Alexander  Procedure(s) Performed: ESOPHAGOGASTRODUODENOSCOPY (EGD) WITH PROPOFOL (N/A )  Patient Location: PACU  Anesthesia Type:General  Level of Consciousness: sedated  Airway & Oxygen Therapy: Patient Spontanous Breathing and Patient connected to nasal cannula oxygen  Post-op Assessment: Report given to RN and Post -op Vital signs reviewed and stable  Post vital signs: Reviewed and stable  Last Vitals:  Vitals Value Taken Time  BP 141/87 12/14/2017 11:34 AM  Temp 36 C 12/14/2017 11:30 AM  Pulse 84 12/14/2017 11:34 AM  Resp 24 12/14/2017 11:34 AM  SpO2 97 % 12/14/2017 11:34 AM  Vitals shown include unvalidated device data.  Last Pain:  Vitals:   12/14/17 1130  TempSrc: Tympanic  PainSc:          Complications: No apparent anesthesia complications

## 2017-12-14 NOTE — Anesthesia Post-op Follow-up Note (Signed)
Anesthesia QCDR form completed.        

## 2017-12-14 NOTE — Op Note (Signed)
[facility] Patient Name: Robert Alexander Procedure Date: 12/14/2017 11:02 AM MRN: [id number] Account #: [id number] Date of Birth: 12/31/1935 Admit Type: Outpatient Age: 82 Room: [facility] ENDO ROOM 1 Gender: Male Note Status: Finalized Procedure:            Upper GI endoscopy Indications:          Dysphagia Providers:            Scot Jun, MD Referring MD:         Duane Lope. Judithann Sheen, MD (Referring MD) Medicines:            Propofol per Anesthesia Complications:        No immediate complications. Procedure:            Pre-Anesthesia Assessment:                       - After reviewing the risks and benefits, the patient                        was deemed in satisfactory condition to undergo the                        procedure.                       After obtaining informed consent, the endoscope was                        passed under direct vision. Throughout the procedure,                        the patient's blood pressure, pulse, and oxygen                        saturations were monitored continuously. The Endoscope                        was introduced through the mouth, and advanced to the                        second part of duodenum. The upper GI endoscopy was                        somewhat difficult due to narrowing. The patient                        tolerated the procedure well. Findings:      A moderate Schatzki ring was found at the gastroesophageal junction. A       guidewire was placed and the scope was withdrawn. Dilation was performed       with a Savary dilator with moderate resistance at 15 mm. The scope was       passed and it did go into the stomach and duodenum and there I passed       the wire down and pulled the scope and then dilated the 15F 3 times and       then passed the scope and the stricture was wide open and some blood       seen in the esophagus and some in stomach.      Patient woke and did not have any  chest  pain. Impression:           - Moderate Schatzki ring. Dilated.                       - No specimens collected. Recommendation:       - The findings and recommendations were discussed with                        the patient's family. Scot Junobert T Asuncion Tapscott, MD 12/14/2017 11:35:19 AM This report has been signed electronically. Number of Addenda: 0 Note Initiated On: 12/14/2017 11:02 AM      [facility]

## 2017-12-14 NOTE — Anesthesia Postprocedure Evaluation (Signed)
Anesthesia Post Note  Patient: Robert KnackJohn J Alexander  Procedure(s) Performed: ESOPHAGOGASTRODUODENOSCOPY (EGD) WITH PROPOFOL (N/A )  Patient location during evaluation: Endoscopy Anesthesia Type: General Level of consciousness: awake and alert and oriented Pain management: pain level controlled Vital Signs Assessment: post-procedure vital signs reviewed and stable Respiratory status: spontaneous breathing, nonlabored ventilation and respiratory function stable Cardiovascular status: blood pressure returned to baseline and stable Postop Assessment: no signs of nausea or vomiting Anesthetic complications: no     Last Vitals:  Vitals:   12/14/17 1150 12/14/17 1200  BP: (!) 156/88 (!) 175/78  Pulse: 68 62  Resp: 12 (!) 4  Temp:    SpO2: 98% 98%    Last Pain:  Vitals:   12/14/17 1200  TempSrc:   PainSc: 0-No pain                 Rollo Farquhar

## 2017-12-18 ENCOUNTER — Encounter: Payer: Self-pay | Admitting: Unknown Physician Specialty

## 2018-01-25 ENCOUNTER — Other Ambulatory Visit: Payer: Self-pay

## 2018-01-25 ENCOUNTER — Emergency Department
Admission: EM | Admit: 2018-01-25 | Discharge: 2018-01-25 | Disposition: A | Discharge status: Home / Self Care | Payer: Medicare Other | Attending: Emergency Medicine | Admitting: Emergency Medicine

## 2018-01-25 ENCOUNTER — Encounter: Payer: Self-pay | Admitting: Emergency Medicine

## 2018-01-25 DIAGNOSIS — S81812A Laceration without foreign body, left lower leg, initial encounter: Secondary | ICD-10-CM | POA: Diagnosis present

## 2018-01-25 DIAGNOSIS — Y998 Other external cause status: Secondary | ICD-10-CM | POA: Diagnosis not present

## 2018-01-25 DIAGNOSIS — E119 Type 2 diabetes mellitus without complications: Secondary | ICD-10-CM | POA: Insufficient documentation

## 2018-01-25 DIAGNOSIS — I10 Essential (primary) hypertension: Secondary | ICD-10-CM | POA: Diagnosis not present

## 2018-01-25 DIAGNOSIS — W228XXA Striking against or struck by other objects, initial encounter: Secondary | ICD-10-CM | POA: Diagnosis not present

## 2018-01-25 DIAGNOSIS — Y9352 Activity, horseback riding: Secondary | ICD-10-CM | POA: Insufficient documentation

## 2018-01-25 DIAGNOSIS — Y9289 Other specified places as the place of occurrence of the external cause: Secondary | ICD-10-CM | POA: Insufficient documentation

## 2018-01-25 MED ORDER — MUPIROCIN 2 % EX OINT: 1.0000 "application " | TOPICAL_OINTMENT | Freq: Two times a day (BID) | CUTANEOUS | 0 refills | Status: DC

## 2018-01-25 MED ORDER — SULFAMETHOXAZOLE-TRIMETHOPRIM 800-160 MG PO TABS: 1.0000 | ORAL_TABLET | Freq: Two times a day (BID) | ORAL | 0 refills | Status: DC

## 2018-01-25 NOTE — Discharge Instructions (Addendum)
Use medications as prescribed.  Return emergency department if worsening.  See your regular doctor.  Your tetanus was up-to-date in 2016 per an old chart.  Please ask your regular doctor if you are in need of a tetanus.

## 2018-01-25 NOTE — ED Notes (Signed)
Pt states he was riding his horse at home, when he fell and hit left leg on a rock. Pt denies hitting head or LOC. Small abrasion noted to left leg with redness around the area. Pt c/o pain with movement. Pt ambulatory to room.

## 2018-01-25 NOTE — ED Triage Notes (Signed)
Left leg laceration from a saddle x 2 days

## 2018-01-25 NOTE — ED Provider Notes (Signed)
[facility] Emergency Department Provider Note  ____________________________________________   First MD Initiated Contact with Patient 01/25/18 1143     (approximate)  I have reviewed the triage vital signs and the nursing notes.   HISTORY  Chief Complaint Laceration    HPI Robert KnackJohn J Alexander is a 82 y.o. male presents emergency department with concerns of an infection and a wound he sustained when riding his horse.  The abrasion happened 2 days ago.  He used a antibiotic ointment his dermatologist to given him.  He had also used Neosporin.  States area is become more red and he was concerned.  He states he thinks his tetanus is up-to-date.    Past Medical History:  Diagnosis Date  . Acute recurrent sinusitis   . Anxiety   . BPH (benign prostatic hyperplasia)   . DDD (degenerative disc disease), lumbar   . Diabetes mellitus without complication (HCC)   . Environmental allergies   . GERD (gastroesophageal reflux disease)   . History of colonic polyps   . History of kidney stones   . Hyperlipidemia   . Hypertension   . Schatzki's ring     Patient Active Problem List   Diagnosis Date Noted  . H/O adenomatous polyp of colon 12/03/2015  . Dysphagia 09/16/2013  . GERD with stricture 09/16/2013  . Anxiety 06/29/2013  . Colon polyps 06/29/2013  . Disc disease, degenerative, lumbar or lumbosacral 06/29/2013  . HTN (hypertension) 06/29/2013  . Hyperglycemia, unspecified 06/29/2013  . Hyperlipidemia, unspecified 06/29/2013  . Kidney stones 06/29/2013  . OA (osteoarthritis) 06/29/2013    Past Surgical History:  Procedure Laterality Date  . ESOPHAGOGASTRODUODENOSCOPY (EGD) WITH PROPOFOL N/A 12/14/2017   Procedure: ESOPHAGOGASTRODUODENOSCOPY (EGD) WITH PROPOFOL;  Surgeon: Scot JunElliott, Robert T, MD;  [facility];  Service: Endoscopy;  Laterality: N/A;  . HEMORRHOID SURGERY    . HERNIA REPAIR    . kidney stones    . KNEE SURGERY    . SHOULDER  SURGERY      Prior to Admission medications   Medication Sig Start Date End Date Taking? Authorizing Provider  acetaminophen (TYLENOL) 325 MG tablet Take 325 mg by mouth daily.    [provider]  amLODipine (NORVASC) 5 MG tablet TAKE 1 TABLET DAILY 06/30/16   [provider]  celecoxib (CELEBREX) 200 MG capsule TAKE 1 CAPSULE TWICE A DAY AS NEEDED FOR PAIN 06/29/15   [provider]  esomeprazole (NEXIUM) 40 MG capsule Take by mouth. 05/22/15   [provider]  Fluticasone-Salmeterol (ADVAIR DISKUS) 250-50 MCG/DOSE AEPB INHALE 1 INHALATION INTO THE LUNGS EVERY 12 (TWELVE) HOURS. 05/07/15   [provider]  gabapentin (NEURONTIN) 100 MG capsule Take 2 capsules in the morning and 2 capsules at bedtime 05/25/17   Hyatt, Max T, DPM  LORazepam (ATIVAN) 1 MG tablet Take 1 mg by mouth at bedtime.  03/08/13   [provider]  meloxicam (MOBIC) 7.5 MG tablet TAKE 1 TABLET BY MOUTH EVERY DAY 05/11/17   Helane GuntherMayer, Gregory, DPM  metaxalone [facility](SKELAXIN) 800 MG tablet  01/24/16   [provider]  montelukast (SINGULAIR) 10 MG tablet TAKE 1 TABLET (10 MG TOTAL) BY MOUTH NIGHTLY. 05/07/15   [provider]  mupirocin ointment (BACTROBAN) 2 % Apply 1 application topically 2 (two) times daily. 01/25/18   Faythe GheeFisher, Dimple Bastyr W, PA-C  NONFORMULARY OR COMPOUNDED ITEM [facility]  Combination Pain Cream -  Baclofen 2%, Doxepin 5%, Gabapentin 6%, Topiramate 2%, Pentoxifylline 3% Apply 1-2 grams  to affected area 3-4 times daily Qty. 120 gm 3 refills 04/13/17   Helane GuntherMayer, Gregory, DPM  omeprazole (PRILOSEC) 40 MG capsule Take 40 mg by mouth daily. 01/22/17   [provider]  sulfamethoxazole-trimethoprim (BACTRIM DS,SEPTRA DS) 800-160 MG tablet Take 1 tablet by mouth 2 (two) times daily. 01/25/18   Faythe GheeFisher, Aki Abalos W, PA-C    Allergies Penicillins  No family history on file.  Social History Social History   Tobacco Use  . Smoking status: Never  Smoker  . Smokeless tobacco: Current User    Types: Chew  Substance Use Topics  . Alcohol use: No  . Drug use: No    Review of Systems  Constitutional: No fever/chills Eyes: No visual changes. ENT: No sore throat. Respiratory: Denies cough Genitourinary: Negative for dysuria. Musculoskeletal: Negative for back pain. Skin: Negative for rash.  Positive for wound    ____________________________________________   PHYSICAL EXAM:  VITAL SIGNS: ED Triage Vitals [01/25/18 1140]  Enc Vitals Group     BP      Pulse      Resp      Temp      Temp src      SpO2      Weight 149 lb 14.6 oz (68 kg)     Height      Head Circumference      Peak Flow      Pain Score 0     Pain Loc      Pain Edu?      Excl. in GC?     Constitutional: Alert and oriented. Well appearing and in no acute distress. Eyes: Conjunctivae are normal.  Head: Atraumatic. Nose: No congestion/rhinnorhea. Mouth/Throat: Mucous membranes are moist.   Neck:  supple no lymphadenopathy noted Cardiovascular: Normal rate, regular rhythm.  Respiratory: Normal respiratory effort.  No retractions GU: deferred Musculoskeletal: FROM all extremities, warm and well perfused Neurologic:  Normal speech and language.  Skin:  Skin is warm, dry and intact.  Old wound is noted with redness and some dried pus, no rash noted. Psychiatric: Mood and affect are normal. Speech and behavior are normal.  ____________________________________________   LABS (all labs ordered are listed, but only abnormal results are displayed)  Labs Reviewed - No data to display ____________________________________________   ____________________________________________  RADIOLOGY    ____________________________________________   PROCEDURES  Procedure(s) performed: No  Procedures    ____________________________________________   INITIAL IMPRESSION / ASSESSMENT AND PLAN / ED COURSE  Pertinent labs & imaging results that were  available during my care of the patient were reviewed by me and considered in my medical decision making (see chart for details).   Patient is a 82 year old male presents emergency department for wound care.  Physical exam shows the left lower leg has a reddened scabbed area.   Explained the findings to the patient.  Due to the infection he was given a prescription for Septra DS 1 twice daily and Bactroban ointment.  He is to use these as directed.  Follow-up with his regular doctor if not better in 3 days.  Return emergency department worsening.  He is also to inquire about his Tdap with his regular physician.  He states he will comply.  He was discharged in stable condition.  As part of my medical decision making, I reviewed the following data within the electronic MEDICAL RECORD NUMBER Nursing notes reviewed and incorporated, Old chart reviewed, Notes from prior ED visits and [place] Controlled Substance Database  ____________________________________________  FINAL CLINICAL IMPRESSION(S) / ED DIAGNOSES  Final diagnoses:  Laceration of left lower extremity, initial encounter      NEW MEDICATIONS STARTED DURING THIS VISIT:  Discharge Medication List as of 01/25/2018 12:15 PM    START taking these medications   Details  mupirocin ointment (BACTROBAN) 2 % Apply 1 application topically 2 (two) times daily., Starting Mon 01/25/2018, Normal    sulfamethoxazole-trimethoprim (BACTRIM DS,SEPTRA DS) 800-160 MG tablet Take 1 tablet by mouth 2 (two) times daily., Starting Mon 01/25/2018, Normal         Note:  This document was prepared using Dragon voice recognition software and may include unintentional dictation errors.    Faythe Ghee, PA-C 01/25/18 1308    Arnaldo Natal, MD 01/25/18 [phone]

## 2018-02-04 ENCOUNTER — Ambulatory Visit (INDEPENDENT_AMBULATORY_CARE_PROVIDER_SITE_OTHER): Payer: Medicare Other | Admitting: Podiatry

## 2018-02-04 ENCOUNTER — Encounter: Payer: Self-pay | Admitting: Podiatry

## 2018-02-04 DIAGNOSIS — S90112A Contusion of left great toe without damage to nail, initial encounter: Secondary | ICD-10-CM | POA: Diagnosis not present

## 2018-02-04 NOTE — Progress Notes (Signed)
This patient presents to the office with chief complaint of pain due to his left big toenail.  Patient says he injured the nail 6 months ago.  It now is causing pain walking and wearing his shoes.  No drainage noted.  He presents to the office for evaluation and treatment.  Vascular  Dorsalis pedis and posterior tibial pulses are palpable  B/L.  Capillary return  WNL.  Temperature gradient is  WNL.  Skin turgor  WNL  Sensorium  Senn Weinstein monofilament wire  WNL. Normal tactile sensation.  Nail Exam  Patient has normal nails with no evidence of bacterial or fungal infection. Patient has unattached nail plate left hallux. Except at proximal nail fold.  No infection noted.  Orthopedic  Exam  Muscle tone and muscle strength  WNL.  No limitations of motion feet  B/L.  No crepitus or joint effusion noted.  Foot type is unremarkable and digits show no abnormalities.  Bony prominences are unremarkable.  Skin  No open lesions.  Normal skin texture and turgor.  Unattached nail plate left hallux following nail injury left hallux.  ROV  Discussed condition with this patient.  He says the nail is growing into the toe. Discussed with the patient that the nail is putting pressure on nail bed due to faact the nail is unattached.  Debride left hallux nail.  RTC prn.   Helane Gunther DPM

## 2018-02-10 ENCOUNTER — Ambulatory Visit: Payer: Medicare Other | Admitting: Podiatry

## 2018-09-28 ENCOUNTER — Encounter: Admission: RE | Admit: 2018-09-28 | Payer: Medicare Other | Source: Ambulatory Visit

## 2018-10-01 ENCOUNTER — Ambulatory Visit: Admit: 2018-10-01 | Payer: Medicare Other | Admitting: Unknown Physician Specialty

## 2018-10-01 SURGERY — ESOPHAGOGASTRODUODENOSCOPY (EGD) WITH PROPOFOL
Anesthesia: General

## 2018-10-07 ENCOUNTER — Other Ambulatory Visit
Admission: RE | Admit: 2018-10-07 | Discharge: 2018-10-07 | Disposition: A | Discharge status: Home / Self Care | Payer: Medicare Other | Source: Ambulatory Visit | Attending: Internal Medicine | Admitting: Internal Medicine

## 2018-10-07 ENCOUNTER — Other Ambulatory Visit: Payer: Self-pay

## 2018-10-07 DIAGNOSIS — Z01812 Encounter for preprocedural laboratory examination: Secondary | ICD-10-CM | POA: Diagnosis not present

## 2018-10-07 DIAGNOSIS — Z20828 Contact with and (suspected) exposure to other viral communicable diseases: Secondary | ICD-10-CM | POA: Insufficient documentation

## 2018-10-07 LAB — SARS CORONAVIRUS 2 (TAT 6-24 HRS): SARS Coronavirus 2: NEGATIVE

## 2018-10-08 ENCOUNTER — Encounter: Payer: Self-pay | Admitting: *Deleted

## 2018-10-11 ENCOUNTER — Encounter: Payer: Self-pay | Admitting: *Deleted

## 2018-10-11 ENCOUNTER — Ambulatory Visit
Admission: RE | Admit: 2018-10-11 | Discharge: 2018-10-11 | Disposition: A | Discharge status: Home / Self Care | Payer: Medicare Other | Source: Ambulatory Visit | Attending: Internal Medicine | Admitting: Internal Medicine

## 2018-10-11 ENCOUNTER — Ambulatory Visit
Admission: RE | Admit: 2018-10-11 | Discharge: 2018-10-11 | Disposition: A | Discharge status: Home / Self Care | Payer: Medicare Other | Attending: Internal Medicine | Admitting: Internal Medicine

## 2018-10-11 ENCOUNTER — Other Ambulatory Visit: Payer: Self-pay

## 2018-10-11 ENCOUNTER — Ambulatory Visit: Payer: Medicare Other | Admitting: Anesthesiology

## 2018-10-11 ENCOUNTER — Other Ambulatory Visit: Payer: Self-pay | Admitting: Internal Medicine

## 2018-10-11 ENCOUNTER — Encounter
Admission: RE | Disposition: A | Discharge status: Home / Self Care | Payer: Self-pay | Source: Home / Self Care | Attending: Internal Medicine

## 2018-10-11 DIAGNOSIS — E785 Hyperlipidemia, unspecified: Secondary | ICD-10-CM | POA: Insufficient documentation

## 2018-10-11 DIAGNOSIS — E119 Type 2 diabetes mellitus without complications: Secondary | ICD-10-CM | POA: Insufficient documentation

## 2018-10-11 DIAGNOSIS — R1310 Dysphagia, unspecified: Secondary | ICD-10-CM

## 2018-10-11 DIAGNOSIS — K219 Gastro-esophageal reflux disease without esophagitis: Secondary | ICD-10-CM | POA: Insufficient documentation

## 2018-10-11 DIAGNOSIS — Z1211 Encounter for screening for malignant neoplasm of colon: Secondary | ICD-10-CM | POA: Diagnosis not present

## 2018-10-11 DIAGNOSIS — Z9889 Other specified postprocedural states: Secondary | ICD-10-CM

## 2018-10-11 DIAGNOSIS — Z79899 Other long term (current) drug therapy: Secondary | ICD-10-CM | POA: Insufficient documentation

## 2018-10-11 DIAGNOSIS — Z791 Long term (current) use of non-steroidal anti-inflammatories (NSAID): Secondary | ICD-10-CM | POA: Insufficient documentation

## 2018-10-11 DIAGNOSIS — F419 Anxiety disorder, unspecified: Secondary | ICD-10-CM | POA: Diagnosis not present

## 2018-10-11 DIAGNOSIS — N400 Enlarged prostate without lower urinary tract symptoms: Secondary | ICD-10-CM | POA: Insufficient documentation

## 2018-10-11 DIAGNOSIS — Z7951 Long term (current) use of inhaled steroids: Secondary | ICD-10-CM | POA: Diagnosis not present

## 2018-10-11 DIAGNOSIS — I10 Essential (primary) hypertension: Secondary | ICD-10-CM | POA: Diagnosis not present

## 2018-10-11 DIAGNOSIS — Z8719 Personal history of other diseases of the digestive system: Secondary | ICD-10-CM | POA: Insufficient documentation

## 2018-10-11 DIAGNOSIS — K222 Esophageal obstruction: Secondary | ICD-10-CM | POA: Insufficient documentation

## 2018-10-11 HISTORY — PX: ESOPHAGOGASTRODUODENOSCOPY (EGD) WITH PROPOFOL: SHX5813

## 2018-10-11 HISTORY — DX: Gastro-esophageal reflux disease without esophagitis: K21.9

## 2018-10-11 HISTORY — PX: COLONOSCOPY WITH PROPOFOL: SHX5780

## 2018-10-11 SURGERY — COLONOSCOPY WITH PROPOFOL
Anesthesia: General

## 2018-10-11 MED ORDER — GLYCOPYRROLATE 0.2 MG/ML IJ SOLN
INTRAMUSCULAR | Status: AC
  Filled 2018-10-11: qty 1

## 2018-10-11 MED ORDER — GLYCOPYRROLATE 0.2 MG/ML IJ SOLN
INTRAMUSCULAR | Status: DC | PRN
  Administered 2018-10-11: 0.2 mg via INTRAVENOUS

## 2018-10-11 MED ORDER — SODIUM CHLORIDE 0.9 % IV SOLN
INTRAVENOUS | Status: DC
  Administered 2018-10-11: 09:00:00 1000 mL via INTRAVENOUS

## 2018-10-11 MED ORDER — MIDAZOLAM HCL 2 MG/2ML IJ SOLN
INTRAMUSCULAR | Status: DC | PRN
  Administered 2018-10-11: 1 mg via INTRAVENOUS

## 2018-10-11 MED ORDER — LIDOCAINE HCL (PF) 2 % IJ SOLN
INTRAMUSCULAR | Status: AC
  Filled 2018-10-11: qty 10

## 2018-10-11 MED ORDER — IOHEXOL 300 MG/ML  SOLN
5.0000 mL | Freq: Once | INTRAMUSCULAR | Status: AC | PRN
  Administered 2018-10-11: 16:00:00 5 mL via ORAL

## 2018-10-11 MED ORDER — PROPOFOL 500 MG/50ML IV EMUL
INTRAVENOUS | Status: AC
  Filled 2018-10-11: qty 50

## 2018-10-11 MED ORDER — LIDOCAINE HCL (CARDIAC) PF 100 MG/5ML IV SOSY
PREFILLED_SYRINGE | INTRAVENOUS | Status: DC | PRN
  Administered 2018-10-11: 60 mg via INTRATRACHEAL

## 2018-10-11 MED ORDER — MIDAZOLAM HCL 2 MG/2ML IJ SOLN
INTRAMUSCULAR | Status: AC
  Filled 2018-10-11: qty 2

## 2018-10-11 MED ORDER — PROPOFOL 10 MG/ML IV BOLUS
INTRAVENOUS | Status: DC | PRN
  Administered 2018-10-11: 30 mg via INTRAVENOUS

## 2018-10-11 MED ORDER — PROPOFOL 500 MG/50ML IV EMUL
INTRAVENOUS | Status: DC | PRN
  Administered 2018-10-11: 50 ug/kg/min via INTRAVENOUS

## 2018-10-11 NOTE — Anesthesia Preprocedure Evaluation (Signed)
Anesthesia Evaluation  Patient identified by MRN, date of birth, ID band Patient awake    Reviewed: Allergy & Precautions, NPO status , Patient's Chart, lab work & pertinent test results  History of Anesthesia Complications Negative for: history of anesthetic complications  Airway Mallampati: II       Dental   Pulmonary neg sleep apnea, neg COPD, Not current smoker,           Cardiovascular hypertension (pt off meds x 1 year), (-) Past MI and (-) CHF (-) dysrhythmias (-) Valvular Problems/Murmurs     Neuro/Psych neg Seizures Anxiety    GI/Hepatic Neg liver ROS, GERD  ,  Endo/Other  neg diabetes  Renal/GU Renal disease (stones)     Musculoskeletal   Abdominal   Peds  Hematology   Anesthesia Other Findings   Reproductive/Obstetrics                            Anesthesia Physical Anesthesia Plan  ASA: III  Anesthesia Plan: General   Post-op Pain Management:    Induction: Intravenous  PONV Risk Score and Plan: 2 and Propofol infusion and TIVA  Airway Management Planned: Nasal Cannula  Additional Equipment:   Intra-op Plan:   Post-operative Plan:   Informed Consent: I have reviewed the patients History and Physical, chart, labs and discussed the procedure including the risks, benefits and alternatives for the proposed anesthesia with the patient or authorized representative who has indicated his/her understanding and acceptance.       Plan Discussed with:   Anesthesia Plan Comments:         Anesthesia Quick Evaluation

## 2018-10-11 NOTE — Interval H&P Note (Signed)
History and Physical Interval Note:  10/11/2018 8:22 AM  Robert Alexander  has presented today for surgery, with the diagnosis of Dysphagia, GERD with stricture Hx polyps.  The various methods of treatment have been discussed with the patient and family. After consideration of risks, benefits and other options for treatment, the patient has consented to  Procedure(s): COLONOSCOPY WITH PROPOFOL (N/A) ESOPHAGOGASTRODUODENOSCOPY (EGD) WITH PROPOFOL (N/A) as a surgical intervention.  The patient's history has been reviewed, patient examined, no change in status, stable for surgery.  I have reviewed the patient's chart and labs.  Questions were answered to the patient's satisfaction.     [place], [place]

## 2018-10-11 NOTE — Anesthesia Postprocedure Evaluation (Signed)
Anesthesia Post Note  Patient: ELMA LIMAS  Procedure(s) Performed: COLONOSCOPY WITH PROPOFOL (N/A ) ESOPHAGOGASTRODUODENOSCOPY (EGD) WITH PROPOFOL (N/A )  Patient location during evaluation: Endoscopy Anesthesia Type: General Level of consciousness: awake and alert Pain management: pain level controlled Vital Signs Assessment: post-procedure vital signs reviewed and stable Respiratory status: spontaneous breathing and respiratory function stable Cardiovascular status: stable Anesthetic complications: no     Last Vitals:  Vitals:   10/11/18 1006 10/11/18 1016  BP: (!) 129/107 (!) 145/84  Pulse: 86 75  Resp: 12 13  Temp:    SpO2: 96% 100%    Last Pain:  Vitals:   10/11/18 1006  TempSrc:   PainSc: (P) 0-No pain                 KEPHART,WILLIAM K

## 2018-10-11 NOTE — Op Note (Addendum)
[facility] Gastroenterology Patient Name: Robert NorrisJohn Alexander Procedure Date: 10/11/2018 9:02 AM MRN: [id number] Account #: [id number] Date of Birth: 1935-10-03 Admit Type: Outpatient Age: [age] [facility] 2 Gender: Male Note Status: Finalized Procedure:            Colonoscopy Indications:          Surveillance: Personal history of adenomatous polyps on                        last colonoscopy > 5 years ago Providers:            Royce Macadamiaeodoro K. [name] MD, MD Referring MD:         Duane LopeJeffrey D. Judithann SheenSparks, MD (Referring MD) Medicines:            Propofol per Anesthesia Complications:        No immediate complications. Procedure:            Pre-Anesthesia Assessment:                       - The risks and benefits of the procedure and the                        sedation options and risks were discussed with the                        patient. All questions were answered and informed                        consent was obtained.                       - Patient identification and proposed procedure were                        verified prior to the procedure by the nurse. The                        procedure was verified in the procedure room.                       - ASA Grade Assessment: III - A patient with severe                        systemic disease.                       - After reviewing the risks and benefits, the patient                        was deemed in satisfactory condition to undergo the                        procedure.                       After obtaining informed consent, the colonoscope was                        passed under direct vision. Throughout the procedure,  the patient's blood pressure, pulse, and oxygen                        saturations were monitored continuously. The                        Colonoscope was introduced through the anus and                        advanced to the the cecum, identified by appendiceal             orifice and ileocecal valve. The colonoscopy was                        performed without difficulty. The patient tolerated the                        procedure well. The quality of the bowel preparation                        was good. The ileocecal valve, appendiceal orifice, and                        rectum were photographed. Findings:      The perianal and digital rectal examinations were normal. Pertinent       negatives include normal sphincter tone and no palpable rectal lesions.      The colon (entire examined portion) appeared normal.      The exam was otherwise without abnormality on direct and retroflexion       views. Impression:           - The entire examined colon is normal.                       - The examination was otherwise normal on direct and                        retroflexion views.                       - No specimens collected. Recommendation:       - Patient has a contact number available for                        emergencies. The signs and symptoms of potential                        delayed complications were discussed with the patient.                        Return to normal activities tomorrow. Written discharge                        instructions were provided to the patient.                       - Resume previous diet.                       - Continue present medications.                       -  No repeat colonoscopy due to current age (81 years or                        older).                       - Return to GI office as previously scheduled.                       - The findings and recommendations were discussed with                        the patient. Procedure Code(s):    --- Professional ---                       X1062, Colorectal cancer screening; colonoscopy on                        individual at high risk Diagnosis Code(s):    --- Professional ---                       Z86.010, Personal history of colonic polyps CPT copyright  2019 American Medical Association. All rights reserved. The codes documented in this report are preliminary and upon coder review may  be revised to meet current compliance requirements. Efrain Sella MD, MD 10/11/2018 9:55:22 AM This report has been signed electronically. Number of Addenda: 0 Note Initiated On: 10/11/2018 9:02 AM Scope Withdrawal Time: 0 hours 7 minutes 7 seconds  Total Procedure Duration: 0 hours 9 minutes 42 seconds  Estimated Blood Loss: Estimated blood loss: none.      [facility]

## 2018-10-11 NOTE — Anesthesia Post-op Follow-up Note (Signed)
Anesthesia QCDR form completed.        

## 2018-10-11 NOTE — Op Note (Signed)
[facility] Gastroenterology Patient Name: Robert NorrisJohn Alexander Procedure Date: 10/11/2018 9:04 AM MRN: [id number] Account #: [id number] Date of Birth: Mar 21, 1935 Admit Type: Outpatient Age: [age] Room[facility] 2 Gender: Male Note Status: Finalized Procedure:            Upper GI endoscopy Indications:          Dysphagia, Follow-up of esophageal stricture Providers:            Boykin Nearingeodoro K. [name] MD, MD Referring MD:         Duane LopeJeffrey D. Judithann SheenSparks, MD (Referring MD) Medicines:            Propofol per Anesthesia Complications:        No immediate complications. Procedure:            Pre-Anesthesia Assessment:                       - The risks and benefits of the procedure and the                        sedation options and risks were discussed with the                        patient. All questions were answered and informed                        consent was obtained.                       - Patient identification and proposed procedure were                        verified prior to the procedure by the nurse. The                        procedure was verified in the procedure room.                       - ASA Grade Assessment: III - A patient with severe                        systemic disease.                       - After reviewing the risks and benefits, the patient                        was deemed in satisfactory condition to undergo the                        procedure.                       After obtaining informed consent, the endoscope was                        passed under direct vision. Throughout the procedure,                        the patient's blood pressure, pulse, and oxygen  saturations were monitored continuously. The Endoscope                        was introduced through the mouth, and advanced to the                        third part of duodenum. The upper GI endoscopy was                        accomplished without difficulty. The  patient tolerated                        the procedure well. Findings:      One benign-appearing, intrinsic moderate stenosis was found at the       gastroesophageal junction. This stenosis measured 1.3 cm (inner       diameter) x less than one cm (in length). The stenosis was traversed.       The scope was withdrawn. Dilation was attempted, but the lesion was not       amenable to treatment with a Maloney dilator because the dilator could       not be passed at 89 Fr. A TTS dilator was passed through the scope.       Dilation with an 18-19-20 mm balloon dilator was performed to 18 mm. The       dilation site was examined following endoscope reinsertion and showed       complete resolution of luminal narrowing. Estimated blood loss was       minimal.      The entire examined stomach was normal.      The examined duodenum was normal.      The cardia and gastric fundus were normal on retroflexion. Impression:           - Benign-appearing esophageal stenosis. Unable to                        dilate. Dilated.                       - Normal stomach.                       - Normal examined duodenum.                       - No specimens collected. Recommendation:       - Proceed with colonoscopy Procedure Code(s):    --- Professional ---                       [id number], Esophagogastroduodenoscopy, flexible, transoral;                        with transendoscopic balloon dilation of esophagus                        (less than 30 mm diameter) Diagnosis Code(s):    --- Professional ---                       R13.10, Dysphagia, unspecified                       K22.2, Esophageal obstruction CPT copyright  2019 American Medical Association. All rights reserved. The codes documented in this report are preliminary and upon coder review may  be revised to meet current compliance requirements. Stanton Kidney MD, MD 10/11/2018 9:38:56 AM This report has been signed electronically. Number of Addenda:  0 Note Initiated On: 10/11/2018 9:04 AM Estimated Blood Loss: Estimated blood loss was minimal.      [facility]

## 2018-10-11 NOTE — Transfer of Care (Signed)
Immediate Anesthesia Transfer of Care Note  Patient: Robert Alexander  Procedure(s) Performed: COLONOSCOPY WITH PROPOFOL (N/A ) ESOPHAGOGASTRODUODENOSCOPY (EGD) WITH PROPOFOL (N/A )  Patient Location: PACU and Endoscopy Unit  Anesthesia Type:General  Level of Consciousness: drowsy and patient cooperative  Airway & Oxygen Therapy: Patient Spontanous Breathing and Patient connected to nasal cannula oxygen  Post-op Assessment: Report given to RN, Post -op Vital signs reviewed and stable and Patient moving all extremities X 4  Post vital signs: Reviewed and stable  Last Vitals:  Vitals Value Taken Time  BP 118/73 10/11/18 0956  Temp 36.7 C 10/11/18 0956  Pulse 80 10/11/18 0956  Resp 14 10/11/18 0956  SpO2 100 % 10/11/18 0956  Vitals shown include unvalidated device data.  Last Pain:  Vitals:   10/11/18 0855  TempSrc: Tympanic  PainSc: 0-No pain         Complications: No apparent anesthesia complications

## 2018-10-11 NOTE — H&P (Signed)
Outpatient short stay form Pre-procedure 10/11/2018 8:22 AM Robert Alexander Robert Alexander, M.D.  Primary Physician: [name], M.D.  Reason for visit:  Personal hx of adenomatous polyps c. 2015  History of present illness:                            Patient presents for colonoscopy for a personal hx of colon polyps. The patient denies abdominal pain, abnormal weight loss or rectal bleeding.   No current facility-administered medications for this encounter.   Medications Prior to Admission  Medication Sig Dispense Refill Last Dose  . acetaminophen (TYLENOL) 325 MG tablet Take 325 mg by mouth daily.     Robert Alexander. amLODipine (NORVASC) 5 MG tablet TAKE 1 TABLET DAILY     . celecoxib (CELEBREX) 200 MG capsule TAKE 1 CAPSULE TWICE A DAY AS NEEDED FOR PAIN     . esomeprazole (NEXIUM) 40 MG capsule Take by mouth.     . Fluticasone-Salmeterol (ADVAIR DISKUS) 250-50 MCG/DOSE AEPB INHALE 1 INHALATION INTO THE LUNGS EVERY 12 (TWELVE) HOURS.     Robert Alexander. gabapentin (NEURONTIN) 100 MG capsule Take 2 capsules in the morning and 2 capsules at bedtime 120 capsule 3   . LORazepam (ATIVAN) 1 MG tablet Take 1 mg by mouth at bedtime.      . meloxicam (MOBIC) 7.5 MG tablet TAKE 1 TABLET BY MOUTH EVERY DAY 30 tablet 0   . metaxalone (SKELAXIN) 800 MG tablet      . montelukast (SINGULAIR) 10 MG tablet TAKE 1 TABLET (10 MG TOTAL) BY MOUTH NIGHTLY.     . mupirocin ointment (BACTROBAN) 2 % Apply 1 application topically 2 (two) times daily. 22 g 0   . NONFORMULARY OR COMPOUNDED ITEM [facility]  Combination Pain Cream -  Baclofen 2%, Doxepin 5%, Gabapentin 6%, Topiramate 2%, Pentoxifylline 3% Apply 1-2 grams to affected area 3-4 times daily Qty. 120 gm 3 refills 1 each 2   . omeprazole (PRILOSEC) 40 MG capsule Take 40 mg by mouth daily.  3   . sulfamethoxazole-trimethoprim (BACTRIM DS,SEPTRA DS) 800-160 MG tablet Take 1 tablet by mouth 2 (two) times daily. 14 tablet 0      Allergies  Allergen Reactions  . Penicillins Rash     Has patient had a PCN reaction causing immediate rash, facial/tongue/throat swelling, SOB or lightheadedness with hypotension: no Has patient had a PCN reaction causing severe rash involving mucus membranes or skin necrosis: no Has patient had a PCN reaction that required hospitalization no Has patient had a PCN reaction occurring within the last 10 years: no If all of the above answers are "NO", then may proceed with Cephalosporin use.  Has patient had a PCN reaction causing immediate rash, facial/tongue/throat swelling, SOB or lightheadedness with hypotension: no Has patient had a PCN reaction causing severe rash involving mucus membranes or skin necrosis: no Has patient had a PCN reaction that required hospitalization no Has patient had a PCN reaction occurring within the last 10 years: no If all of the above answers are "NO", then may proceed with Cephalosporin use.     Past Medical History:  Diagnosis Date  . Acute recurrent sinusitis   . Anxiety   . BPH (benign prostatic hyperplasia)   . DDD (degenerative disc disease), lumbar   . DDD (degenerative disc disease), lumbar   . Diabetes mellitus without complication (HCC)   . Environmental allergies   . Esophageal reflux   . GERD (gastroesophageal reflux  disease)   . History of colonic polyps   . History of kidney stones   . Hyperlipidemia   . Hypertension   . Schatzki's ring     Review of systems:  Otherwise negative.    Physical Exam  Gen: Alert, oriented. Appears stated age.  HEENT: [place]/AT. PERRLA. Lungs: CTA, no wheezes. CV: RR nl S1, S2. Abd: soft, benign, no masses. BS+ Ext: No edema. Pulses 2+    Planned procedures: Proceed with colonoscopy. The patient understands the nature of the planned procedure, indications, risks, alternatives and potential complications including but not limited to bleeding, infection, perforation, damage to internal organs and possible oversedation/side effects from anesthesia. The  patient agrees and gives consent to proceed.  Please refer to procedure notes for findings, recommendations and patient disposition/instructions.     Robert Alexander Robert Alexander, M.D. Gastroenterology 10/11/2018  8:22 AM

## 2018-10-12 ENCOUNTER — Encounter: Payer: Self-pay | Admitting: Internal Medicine

## 2018-10-15 DIAGNOSIS — Z8719 Personal history of other diseases of the digestive system: Secondary | ICD-10-CM | POA: Insufficient documentation

## 2018-10-15 DIAGNOSIS — R070 Pain in throat: Secondary | ICD-10-CM | POA: Insufficient documentation

## 2018-10-18 ENCOUNTER — Other Ambulatory Visit: Payer: Self-pay | Admitting: Internal Medicine

## 2018-10-18 DIAGNOSIS — R911 Solitary pulmonary nodule: Secondary | ICD-10-CM

## 2018-10-27 ENCOUNTER — Ambulatory Visit
Admission: RE | Admit: 2018-10-27 | Discharge: 2018-10-27 | Disposition: A | Discharge status: Home / Self Care | Payer: Medicare Other | Source: Ambulatory Visit | Attending: Internal Medicine | Admitting: Internal Medicine

## 2018-10-27 ENCOUNTER — Other Ambulatory Visit: Payer: Self-pay

## 2018-10-27 DIAGNOSIS — R911 Solitary pulmonary nodule: Secondary | ICD-10-CM | POA: Diagnosis not present

## 2019-07-16 ENCOUNTER — Encounter: Payer: Self-pay | Admitting: Emergency Medicine

## 2019-07-16 ENCOUNTER — Other Ambulatory Visit: Payer: Self-pay

## 2019-07-16 ENCOUNTER — Emergency Department
Admission: EM | Admit: 2019-07-16 | Discharge: 2019-07-16 | Disposition: A | Discharge status: Home / Self Care | Payer: Medicare Other | Attending: Emergency Medicine | Admitting: Emergency Medicine

## 2019-07-16 ENCOUNTER — Emergency Department: Payer: Medicare Other

## 2019-07-16 DIAGNOSIS — F17220 Nicotine dependence, chewing tobacco, uncomplicated: Secondary | ICD-10-CM | POA: Diagnosis not present

## 2019-07-16 DIAGNOSIS — R1032 Left lower quadrant pain: Secondary | ICD-10-CM | POA: Diagnosis not present

## 2019-07-16 DIAGNOSIS — R339 Retention of urine, unspecified: Secondary | ICD-10-CM | POA: Diagnosis not present

## 2019-07-16 DIAGNOSIS — Z79899 Other long term (current) drug therapy: Secondary | ICD-10-CM | POA: Insufficient documentation

## 2019-07-16 DIAGNOSIS — R300 Dysuria: Secondary | ICD-10-CM | POA: Diagnosis not present

## 2019-07-16 DIAGNOSIS — I10 Essential (primary) hypertension: Secondary | ICD-10-CM | POA: Insufficient documentation

## 2019-07-16 DIAGNOSIS — Z87442 Personal history of urinary calculi: Secondary | ICD-10-CM | POA: Diagnosis not present

## 2019-07-16 LAB — URINALYSIS, COMPLETE (UACMP) WITH MICROSCOPIC
Bacteria, UA: NONE SEEN
Bilirubin Urine: NEGATIVE
Glucose, UA: NEGATIVE mg/dL
Hgb urine dipstick: NEGATIVE
Ketones, ur: NEGATIVE mg/dL
Leukocytes,Ua: NEGATIVE
Nitrite: NEGATIVE
Protein, ur: 100 mg/dL — AB
Specific Gravity, Urine: 1.028 (ref 1.005–1.030)
Squamous Epithelial / HPF: NONE SEEN (ref 0–5)
pH: 5 (ref 5.0–8.0)

## 2019-07-16 LAB — CBC
HCT: 44.7 % (ref 39.0–52.0)
Hemoglobin: 15.3 g/dL (ref 13.0–17.0)
MCH: 31.7 pg (ref 26.0–34.0)
MCHC: 34.2 g/dL (ref 30.0–36.0)
MCV: 92.7 fL (ref 80.0–100.0)
Platelets: 210 10*3/uL (ref 150–400)
RBC: 4.82 MIL/uL (ref 4.22–5.81)
RDW: 12.1 % (ref 11.5–15.5)
WBC: 6.4 10*3/uL (ref 4.0–10.5)
nRBC: 0 % (ref 0.0–0.2)

## 2019-07-16 LAB — COMPREHENSIVE METABOLIC PANEL
ALT: 17 U/L (ref 0–44)
AST: 19 U/L (ref 15–41)
Albumin: 3.9 g/dL (ref 3.5–5.0)
Alkaline Phosphatase: 80 U/L (ref 38–126)
Anion gap: 9 (ref 5–15)
BUN: 20 mg/dL (ref 8–23)
CO2: 29 mmol/L (ref 22–32)
Calcium: 8.9 mg/dL (ref 8.9–10.3)
Chloride: 101 mmol/L (ref 98–111)
Creatinine, Ser: 1.23 mg/dL (ref 0.61–1.24)
GFR calc Af Amer: >60 mL/min (ref >60)
GFR calc non Af Amer: 54 mL/min — ABNORMAL LOW (ref >60)
Glucose, Bld: 138 mg/dL — ABNORMAL HIGH (ref 70–99)
Potassium: 3.6 mmol/L (ref 3.5–5.1)
Sodium: 139 mmol/L (ref 135–145)
Total Bilirubin: 1.7 mg/dL — ABNORMAL HIGH (ref 0.3–1.2)
Total Protein: 6.6 g/dL (ref 6.5–8.1)

## 2019-07-16 LAB — LIPASE, BLOOD: Lipase: 44 U/L (ref 11–51)

## 2019-07-16 MED ORDER — IOHEXOL 300 MG/ML  SOLN
100.0000 mL | Freq: Once | INTRAMUSCULAR | Status: AC | PRN
  Administered 2019-07-16: 100 mL via INTRAVENOUS

## 2019-07-16 MED ORDER — SODIUM CHLORIDE 0.9% FLUSH: 3.0000 mL | Freq: Once | INTRAVENOUS | Status: DC

## 2019-07-16 MED ORDER — ONDANSETRON HCL 4 MG/2ML IJ SOLN
4.0000 mg | Freq: Once | INTRAMUSCULAR | Status: AC
  Administered 2019-07-16: 4 mg via INTRAVENOUS
  Filled 2019-07-16: qty 2

## 2019-07-16 MED ORDER — MORPHINE SULFATE (PF) 4 MG/ML IV SOLN
4.0000 mg | Freq: Once | INTRAVENOUS | Status: AC
  Administered 2019-07-16: 4 mg via INTRAVENOUS
  Filled 2019-07-16: qty 1

## 2019-07-16 MED ORDER — NAPROXEN 250 MG PO TABS: 250.0000 mg | ORAL_TABLET | Freq: Two times a day (BID) | ORAL | 0 refills | Status: DC

## 2019-07-16 NOTE — ED Provider Notes (Signed)
[facility] Emergency Department Provider Note   ____________________________________________    I have reviewed the triage vital signs and the nursing notes.   HISTORY  Chief Complaint Abdominal Pain and Urinary Retention     HPI Robert Alexander is a 84 y.o. male with a history of BPH, kidney stones presents with complaints of left lower quadrant abdominal pain and some burning with urination and difficulty urinating.  Patient reports symptoms started in the last 12 to 24 hours.  He does report that he has been lifting many things that are too heavy for him.  Denies hernia or bulging.  No fevers or chills.  Has not take anything for this.  He is able to urinate but feels like he is not voiding fully.  Normal bowel movement this morning.  Past Medical History:  Diagnosis Date  . Acute recurrent sinusitis   . Anxiety   . BPH (benign prostatic hyperplasia)   . DDD (degenerative disc disease), lumbar   . DDD (degenerative disc disease), lumbar   . Environmental allergies   . Esophageal reflux   . GERD (gastroesophageal reflux disease)   . History of colonic polyps   . History of kidney stones   . Hyperlipidemia   . Hypertension   . Schatzki's ring     Patient Active Problem List   Diagnosis Date Noted  . H/O adenomatous polyp of colon 12/03/2015  . Dysphagia 09/16/2013  . GERD with stricture 09/16/2013  . Anxiety 06/29/2013  . Colon polyps 06/29/2013  . Disc disease, degenerative, lumbar or lumbosacral 06/29/2013  . HTN (hypertension) 06/29/2013  . Hyperglycemia, unspecified 06/29/2013  . Hyperlipidemia, unspecified 06/29/2013  . Kidney stones 06/29/2013  . OA (osteoarthritis) 06/29/2013    Past Surgical History:  Procedure Laterality Date  . COLONOSCOPY    . COLONOSCOPY WITH PROPOFOL N/A 10/11/2018   Procedure: COLONOSCOPY WITH PROPOFOL;  Surgeon: Toledo, Boykin Nearing, MD;  Location: [facility] ENDOSCOPY;  Service: Gastroenterology;  Laterality:  N/A;  . ESOPHAGOGASTRODUODENOSCOPY (EGD) WITH PROPOFOL N/A 12/14/2017   Procedure: ESOPHAGOGASTRODUODENOSCOPY (EGD) WITH PROPOFOL;  Surgeon: Scot Jun, MD;  Location: [facility] ENDOSCOPY;  Service: Endoscopy;  Laterality: N/A;  . ESOPHAGOGASTRODUODENOSCOPY (EGD) WITH PROPOFOL N/A 10/11/2018   Procedure: ESOPHAGOGASTRODUODENOSCOPY (EGD) WITH PROPOFOL;  Surgeon: Toledo, Boykin Nearing, MD;  Location: [facility] ENDOSCOPY;  Service: Gastroenterology;  Laterality: N/A;  . HEMORRHOID SURGERY    . HERNIA REPAIR    . kidney stones    . KNEE SURGERY    . SHOULDER SURGERY      Prior to Admission medications   Medication Sig Start Date End Date Taking? Authorizing Provider  celecoxib (CELEBREX) 200 MG capsule Take 200 mg by mouth 2 (two) times daily.    Yes [provider]  LORazepam (ATIVAN) 1 MG tablet Take 1 mg by mouth at bedtime as needed for anxiety or sleep.    Yes [provider]  acetaminophen (TYLENOL) 325 MG tablet Take 325 mg by mouth daily.    [provider]  naproxen (NAPROSYN) 250 MG tablet Take 1 tablet (250 mg total) by mouth 2 (two) times daily with a meal. 07/16/19   Jene Every, MD  omeprazole (PRILOSEC) 40 MG capsule Take 40 mg by mouth daily. 01/22/17   [provider]     Allergies Penicillins  No family history on file.  Social History Social History   Tobacco Use  . Smoking status: Never Smoker  . Smokeless tobacco: Current User  Types: Chew  Vaping Use  . Vaping Use: Never used  Substance Use Topics  . Alcohol use: No  . Drug use: Never    Review of Systems  Constitutional: No fever/chills Eyes: No visual changes.  ENT: No sore throat. Cardiovascular: Denies chest pain. Respiratory: Denies shortness of breath. Gastrointestinal: As above Genitourinary: As above Musculoskeletal: Negative for back pain. Skin: Negative for rash. Neurological: Negative for headaches or  weakness   ____________________________________________   PHYSICAL EXAM:  VITAL SIGNS: ED Triage Vitals  Enc Vitals Group     BP 07/16/19 0812 (!) 164/68     Pulse Rate 07/16/19 0812 65     Resp 07/16/19 0812 20     Temp 07/16/19 0812 98.2 F (36.8 C)     Temp Source 07/16/19 0812 Oral     SpO2 07/16/19 0812 97 %     Weight 07/16/19 0812 58 kg (127 lb 13.9 oz)     Height 07/16/19 0812 1.702 m (5\' 7" )     Head Circumference --      Peak Flow --      Pain Score 07/16/19 0958 10     Pain Loc --      Pain Edu? --      Excl. in [place]? --     Constitutional: Alert and oriented.   Nose: No congestion/rhinnorhea. Mouth/Throat: Mucous membranes are moist.    Cardiovascular: Normal rate, regular rhythm. Grossly normal heart sounds.  Good peripheral circulation. Respiratory: Normal respiratory effort.  No retractions. Lungs CTAB. Gastrointestinal: Soft and nontender. No distention.  No CVA tenderness. Genitourinary: Normal penis, no rash or fungal infection, no discharge.  Normal scrotum, no evidence of hernia Musculoskeletal:   Warm and well perfused Neurologic:  Normal speech and language. No gross focal neurologic deficits are appreciated.  Skin:  Skin is warm, dry and intact. [name] Psychiatric: Mood and affect are normal. Speech and behavior are normal.  ____________________________________________   LABS (all labs ordered are listed, but only abnormal results are displayed)  Labs Reviewed  COMPREHENSIVE METABOLIC PANEL - Abnormal; Notable for the following components:      Result Value   Glucose, Bld 138 (*)    Total Bilirubin 1.7 (*)    GFR calc non Af Amer 54 (*)    All other components within normal limits  URINALYSIS, COMPLETE (UACMP) WITH MICROSCOPIC - Abnormal; Notable for the following components:   Color, Urine AMBER (*)    APPearance HAZY (*)    Protein, ur 100 (*)    All other components within normal limits  URINE CULTURE  LIPASE, BLOOD  CBC    ____________________________________________  EKG  None ____________________________________________  RADIOLOGY  CT abdomen pelvis ____________________________________________   PROCEDURES  Procedure(s) performed: No  Procedures   Critical Care performed: No ____________________________________________   INITIAL IMPRESSION / ASSESSMENT AND PLAN / ED COURSE  Pertinent labs & imaging results that were available during my care of the patient were reviewed by me and considered in my medical decision making (see chart for details).  Patient presents with left lower quadrant abdominal pain with some urinary retention.  Differential includes diverticulitis, urinary tract infection, ureterolithiasis, colitis/constipation/musculoskeletal pain.  Will give IV morphine, IV Zofran as he says his pain is 8 out of 10.  Patient does describe the fact that he cut down to pine trees and cut them up with a chainsaw recently  Lab work thus far is reassuring, normal white blood cell count.  Urinalysis negative for hemoglobin.  Will send for CT abdomen pelvis to evaluate for diverticulitis.  CT imaging is reassuring, no evidence of diverticulitis, no ureterolithiasis.  Patient is improved after treatment here.  Lab work again reviewed and is unremarkable.  Patient appropriate for outpatient follow-up, return precautions discussed, well Rx NSAIDs    ____________________________________________   FINAL CLINICAL IMPRESSION(S) / ED DIAGNOSES  Final diagnoses:  Left lower quadrant abdominal pain        Note:  This document was prepared using Dragon voice recognition software and may include unintentional dictation errors.   Jene Every, MD 07/16/19 1229

## 2019-07-16 NOTE — ED Triage Notes (Signed)
Pt presents to ED via POV with c/o LUQ abdominal pain x several days, pt also c/o urinary retention at this time. Pt ambulatory with steady gait, NAD noted at this time.

## 2019-07-17 LAB — URINE CULTURE: Culture: NO GROWTH

## 2020-07-05 ENCOUNTER — Ambulatory Visit (INDEPENDENT_AMBULATORY_CARE_PROVIDER_SITE_OTHER): Payer: Medicare Other | Admitting: Podiatry

## 2020-07-05 ENCOUNTER — Encounter: Payer: Self-pay | Admitting: Podiatry

## 2020-07-05 ENCOUNTER — Other Ambulatory Visit: Payer: Self-pay

## 2020-07-05 DIAGNOSIS — B351 Tinea unguium: Secondary | ICD-10-CM | POA: Diagnosis not present

## 2020-07-05 DIAGNOSIS — M79674 Pain in right toe(s): Secondary | ICD-10-CM | POA: Diagnosis not present

## 2020-07-05 DIAGNOSIS — M79675 Pain in left toe(s): Secondary | ICD-10-CM | POA: Diagnosis not present

## 2020-07-05 NOTE — Progress Notes (Signed)
  Subjective:  Patient ID: Robert Alexander, male    DOB: 1935-12-13,  MRN: [id number]  Chief Complaint  Patient presents with   Nail Problem    PT stated that his nails are causing him some discomfort and he feels like the left foot 2nd nail is going to come off    85 y.o. male returns for the above complaint.  Patient presents with thickened elongated dystrophic toenails x10.  Patient would like to have them debrided down.  He states his second toe is causing him some pain.  He does not want to have it removed or does not want to do anything with it.  He would like his nails debrided down as it hurts with ambulation.  He denies any other acute complaints.  Objective:  There were no vitals filed for this visit. Podiatric Exam: Vascular: dorsalis pedis and posterior tibial pulses are palpable bilateral. Capillary return is immediate. Temperature gradient is WNL. Skin turgor WNL  Sensorium: Normal Semmes Weinstein monofilament test. Normal tactile sensation bilaterally. Nail Exam: Pt has thick disfigured discolored nails with subungual debris noted bilateral entire nail hallux through fifth toenails.  Pain on palpation to the nails. Ulcer Exam: There is no evidence of ulcer or pre-ulcerative changes or infection. Orthopedic Exam: Muscle tone and strength are WNL. No limitations in general ROM. No crepitus or effusions noted. HAV  B/L.  Hammer toes 2-5  B/L. Skin: No Porokeratosis. No infection or ulcers    Assessment & Plan:   1. Pain due to onychomycosis of toenails of both feet     Patient was evaluated and treated and all questions answered.  Onychomycosis with pain  -Nails palliatively debrided as below. -Educated on self-care  Procedure: Nail Debridement Rationale: pain  Type of Debridement: manual, sharp debridement. Instrumentation: Nail nipper, rotary burr. Number of Nails: 10  Procedures and Treatment: Consent by patient was obtained for treatment procedures. The patient  understood the discussion of treatment and procedures well. All questions were answered thoroughly reviewed. Debridement of mycotic and hypertrophic toenails, 1 through 5 bilateral and clearing of subungual debris. No ulceration, no infection noted.  Return Visit-Office Procedure: Patient instructed to return to the office for a follow up visit 3 months for continued evaluation and treatment.  Nicholes Rough, DPM    No follow-ups on file.

## 2020-08-02 ENCOUNTER — Ambulatory Visit (INDEPENDENT_AMBULATORY_CARE_PROVIDER_SITE_OTHER): Payer: Medicare Other | Admitting: Podiatry

## 2020-08-02 ENCOUNTER — Other Ambulatory Visit: Payer: Self-pay

## 2020-08-02 ENCOUNTER — Encounter: Payer: Self-pay | Admitting: Podiatry

## 2020-08-02 DIAGNOSIS — B351 Tinea unguium: Secondary | ICD-10-CM

## 2020-08-02 MED ORDER — CICLOPIROX 8 % EX SOLN: Freq: Every day | CUTANEOUS | 0 refills | Status: DC

## 2020-08-02 NOTE — Progress Notes (Signed)
Subjective:  Patient ID: Robert Alexander, male    DOB: Dec 23, 1935,  MRN: [id number]  Chief Complaint  Patient presents with   Nail Problem    Pain in nails  Possible nail fungus     85 y.o. male presents with the above complaint.  Patient presents with thickened elongated dystrophic bilateral hallux and left second digit mostly.  Patient states is painful to touch she would like to discuss treatment options for it.  He thinks that there is fungus in the nail.  I have treated him in the past for onychomycosis of all toenails but these 3 tends to be the most painful.  Denies any other acute complaints like to discuss treatment options for this.   Review of Systems: Negative except as noted in the HPI. Denies N/V/F/Ch.  Past Medical History:  Diagnosis Date   Acute recurrent sinusitis    Anxiety    BPH (benign prostatic hyperplasia)    DDD (degenerative disc disease), lumbar    DDD (degenerative disc disease), lumbar    Environmental allergies    Esophageal reflux    GERD (gastroesophageal reflux disease)    History of colonic polyps    History of kidney stones    Hyperlipidemia    Hypertension    Schatzki's ring     Current Outpatient Medications:    ciclopirox (PENLAC) 8 % solution, Apply topically at bedtime. Apply over nail and surrounding skin. Apply daily over previous coat. After seven (7) days, may remove with alcohol and continue cycle., Disp: 6.6 mL, Rfl: 0   acetaminophen (TYLENOL) 325 MG tablet, Take 325 mg by mouth daily., Disp: , Rfl:    celecoxib (CELEBREX) 200 MG capsule, Take 200 mg by mouth 2 (two) times daily. , Disp: , Rfl:    LORazepam (ATIVAN) 1 MG tablet, Take 1 mg by mouth at bedtime as needed for anxiety or sleep. , Disp: , Rfl:    naproxen (NAPROSYN) 250 MG tablet, Take 1 tablet (250 mg total) by mouth 2 (two) times daily with a meal., Disp: 20 tablet, Rfl: 0   omeprazole (PRILOSEC) 40 MG capsule, Take 40 mg by mouth daily., Disp: , Rfl: 3  Social  History   Tobacco Use  Smoking Status Never  Smokeless Tobacco Current   Types: Chew    Allergies  Allergen Reactions   Penicillins Rash    Has patient had a PCN reaction causing immediate rash, facial/tongue/throat swelling, SOB or lightheadedness with hypotension: no Has patient had a PCN reaction causing severe rash involving mucus membranes or skin necrosis: no Has patient had a PCN reaction that required hospitalization no Has patient had a PCN reaction occurring within the last 10 years: no If all of the above answers are "NO", then may proceed with Cephalosporin use.  Has patient had a PCN reaction causing immediate rash, facial/tongue/throat swelling, SOB or lightheadedness with hypotension: no Has patient had a PCN reaction causing severe rash involving mucus membranes or skin necrosis: no Has patient had a PCN reaction that required hospitalization no Has patient had a PCN reaction occurring within the last 10 years: no If all of the above answers are "NO", then may proceed with Cephalosporin use.   Objective:  There were no vitals filed for this visit. There is no height or weight on file to calculate BMI. Constitutional Well developed. Well nourished.  Vascular Dorsalis pedis pulses palpable bilaterally. Posterior tibial pulses palpable bilaterally. Capillary refill normal to all digits.  No cyanosis or clubbing  noted. Pedal hair growth normal.  Neurologic Normal speech. Oriented to person, place, and time. Epicritic sensation to light touch grossly present bilaterally.  Dermatologic Nails thickened elongated dystrophic mycotic toenails x10 but worst being bilateral hallux and left second digit.  Mild pain on palpation Skin within normal limits  Orthopedic: Normal joint ROM without pain or crepitus bilaterally. No visible deformities. No bony tenderness.   Radiographs: None Assessment:   1. Onychomycosis due to dermatophyte   2. Nail fungus    Plan:   Patient was evaluated and treated and all questions answered.  Bilateral hallux and left second digit onychomycosis -Educated the patient on the etiology of onychomycosis and various treatment options associated with improving the fungal load.  I explained to the patient that there is 3 treatment options available to treat the onychomycosis including topical, p.o., laser treatment.  Patient has elected to undergo topical application with Penlac.  I discussed with him that he would apply once a day for neck 6 to 8 months at minimum.  He can also do twice a day application.  I discussed this with the patient extensively he states understanding. -Penlac was sent to the pharmacy    No follow-ups on file.

## 2020-10-11 ENCOUNTER — Ambulatory Visit: Payer: Medicare Other | Admitting: Podiatry

## 2021-01-14 IMAGING — CT CT ABD-PELV W/ CM
2 of 5 series · 16 of 46 positions shown, 18 images · IV contrast (APPLIED)
Comparison: 01/22/2015

CLINICAL DATA: Left upper abdominal pain

EXAM:
CT ABDOMEN AND PELVIS WITH CONTRAST
TECHNIQUE: Multidetector CT imaging of the abdomen and pelvis was performed
using the standard protocol following bolus administration of
intravenous contrast.
CONTRAST:  100mL OMNIPAQUE IOHEXOL 300 MG/ML  SOLN

[Series 2: routine abd/pel with · axial · 0.66mm/px · z∈[-1059,-624]mm · 13 of 99 slices shown, 15 images]
[im 6/99  soft-tissue]
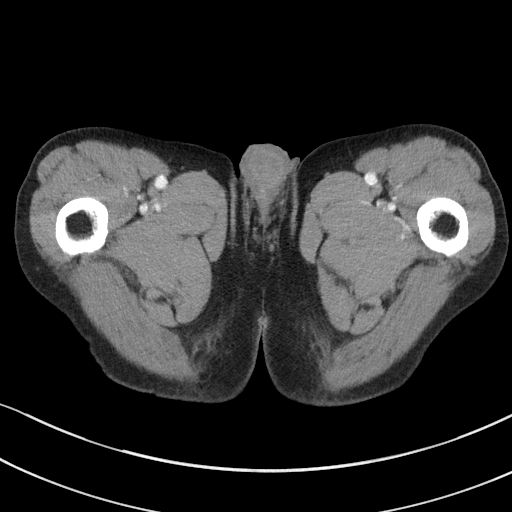
[im 6/99  bone]
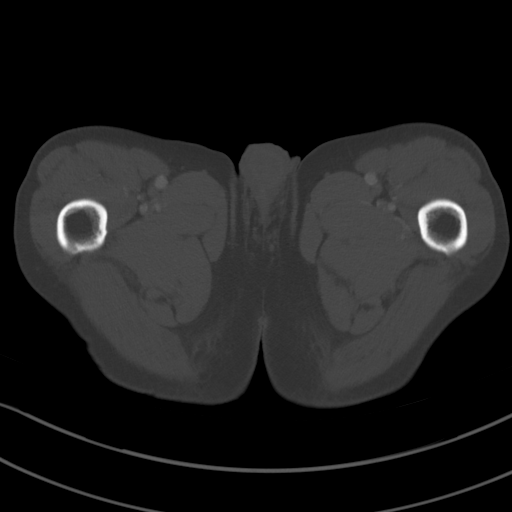
[im 16/99  soft-tissue]
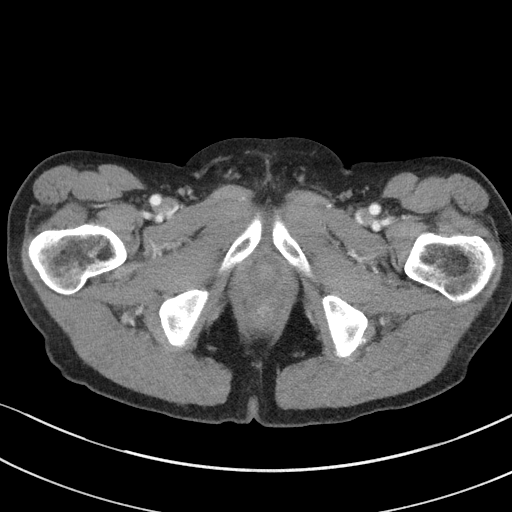
[im 21/99  soft-tissue]
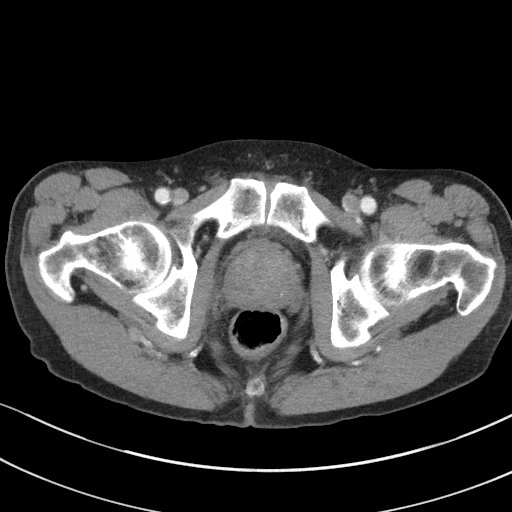
[im 26/99  soft-tissue]
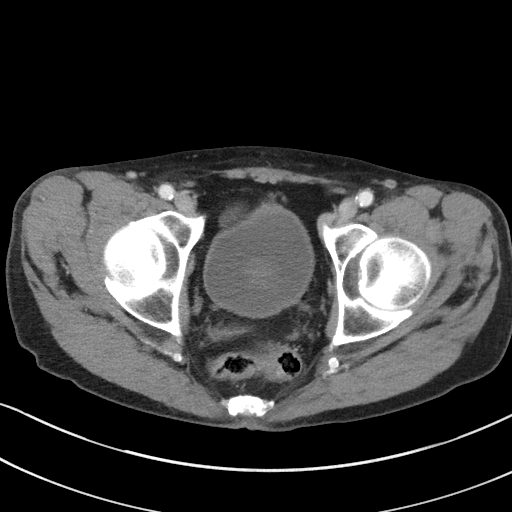
[im 37/99  soft-tissue]
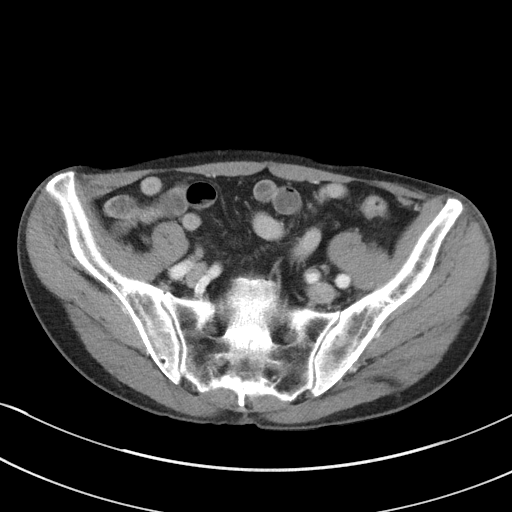
[im 42/99  soft-tissue]
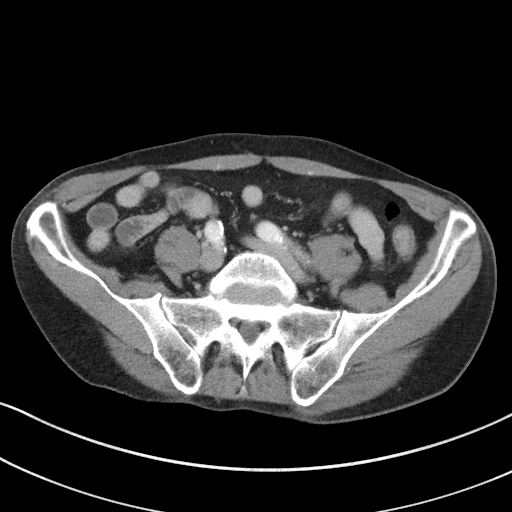
[im 52/99  soft-tissue]
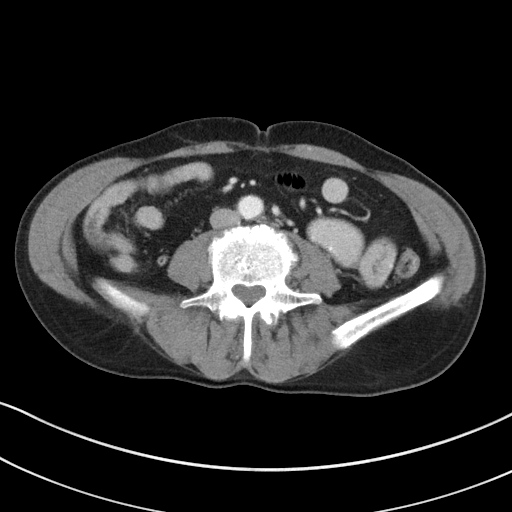
[im 57/99  soft-tissue]
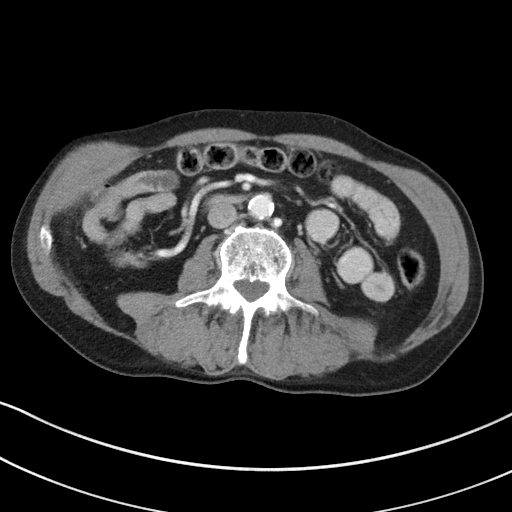
[im 62/99  soft-tissue]
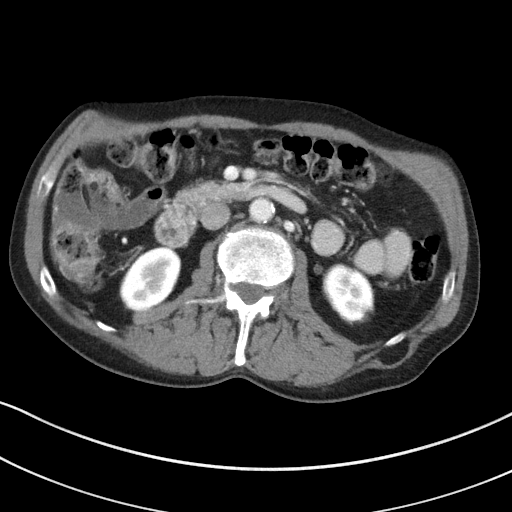
[im 62/99  bone]
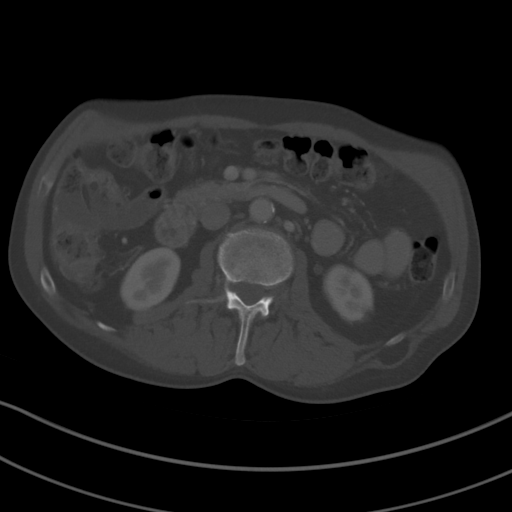
[im 73/99  soft-tissue]
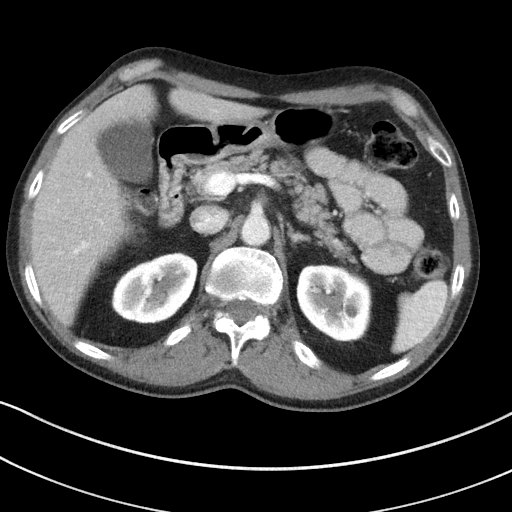
[im 78/99  soft-tissue]
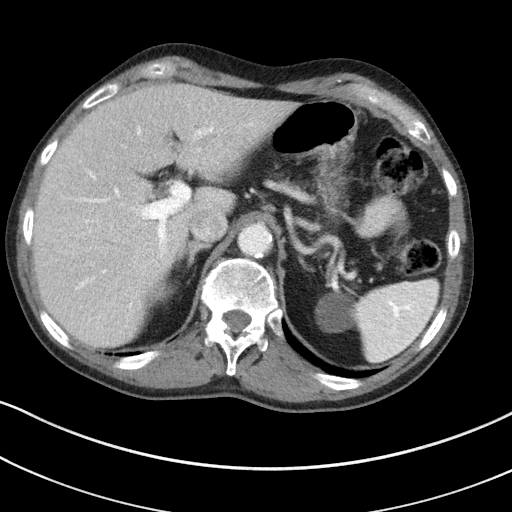
[im 83/99  soft-tissue]
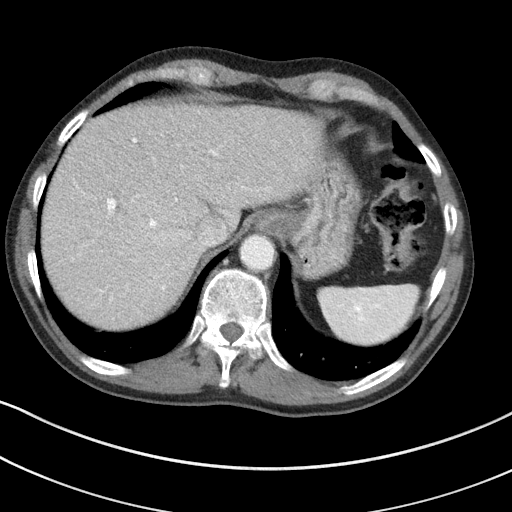
[im 93/99  soft-tissue]
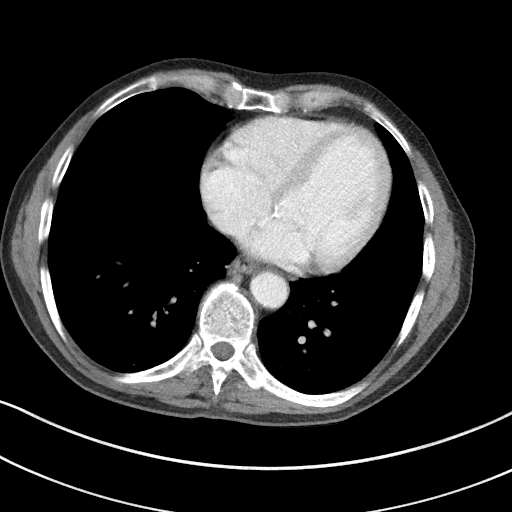

[Series 5: coronal st · coronal · 0.68mm/px · 3 of 83 slices shown]
[im 28/83  soft-tissue]
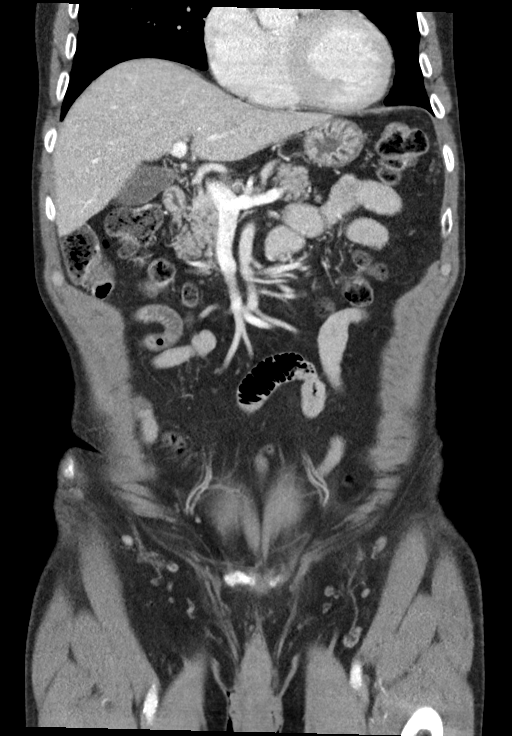
[im 37/83  soft-tissue]
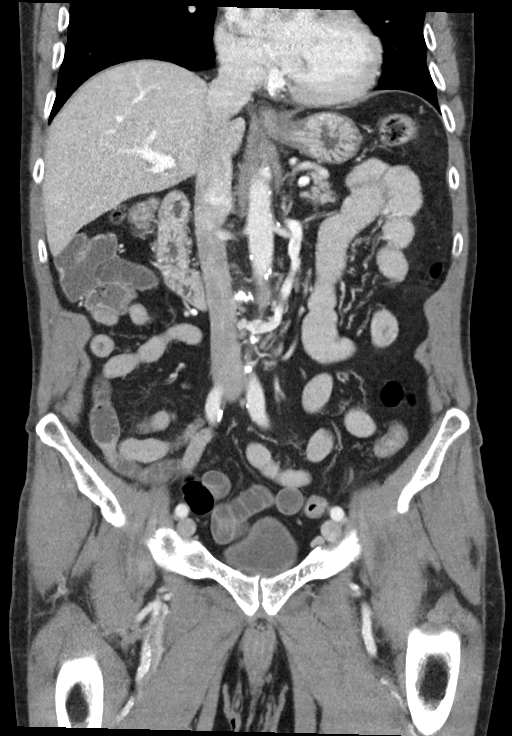
[im 46/83  soft-tissue]
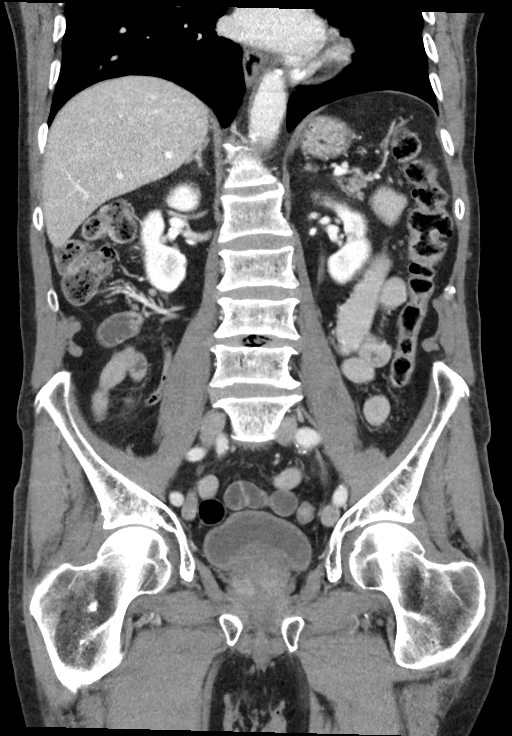

[16 of 46 positions shown; findings below may reference images not displayed]

FINDINGS: Lower chest: Minimal dependent atelectasis posteriorly in the lung
bases. No pleural or pericardial effusion.

Hepatobiliary: No focal liver abnormality is seen. No gallstones,
gallbladder wall thickening, or biliary dilatation.

Pancreas: Unremarkable. No pancreatic ductal dilatation or
surrounding inflammatory changes.

Spleen: Normal in size without focal abnormality.

Adrenals/Urinary Tract: Adrenal glands unremarkable. Small bilateral
renal cysts, largest 2.6 cm from the left upper pole. No
hydronephrosis. Urinary bladder incompletely distended.

Stomach/Bowel: Stomach nondistended. Small bowel is nondilated.
Normal appendix. The colon is nondilated, unremarkable. No
significant diverticular disease.

Vascular/Lymphatic: Moderate partially calcified aortoiliac
atheromatous plaque without aneurysm or high-grade stenosis.
Circumaortic left renal vein, an anatomic variant. No abdominal or
pelvic adenopathy.

Reproductive: Prostate enlargement with central coarse
calcifications.

Other: Left pelvic phleboliths.  No ascites.  No free air.

Musculoskeletal: Mild multilevel lumbar spondylitic change. Negative
for fracture or worrisome bone lesion.
IMPRESSION: 1. No acute findings.
2. Prostate enlargement.
3. Bilateral renal cysts.

Aortic Atherosclerosis ([id number]-[id number]).

## 2021-01-24 ENCOUNTER — Other Ambulatory Visit: Payer: Self-pay

## 2021-01-24 ENCOUNTER — Ambulatory Visit (INDEPENDENT_AMBULATORY_CARE_PROVIDER_SITE_OTHER): Payer: Medicare Other | Admitting: Podiatry

## 2021-01-24 DIAGNOSIS — B351 Tinea unguium: Secondary | ICD-10-CM

## 2021-01-24 MED ORDER — CICLOPIROX 8 % EX SOLN: Freq: Every day | CUTANEOUS | 0 refills | Status: DC

## 2021-01-29 ENCOUNTER — Ambulatory Visit: Payer: Medicare Other | Admitting: Podiatry

## 2021-01-29 NOTE — Progress Notes (Signed)
Subjective:  Patient ID: Robert Alexander, male    DOB: 1935-09-17,  MRN: [id number]  Chief Complaint  Patient presents with   Nail Problem    Nail fungus     86 y.o. male presents with the above complaint.  Patient presents with thickened elongated dystrophic bilateral hallux and left second digit mostly.  Patient states that the Penlac has helped.  It is slowly improving.  He would like to get some more Penlac.  He denies any other acute complaints.   Review of Systems: Negative except as noted in the HPI. Denies N/V/F/Ch.  Past Medical History:  Diagnosis Date   Acute recurrent sinusitis    Anxiety    BPH (benign prostatic hyperplasia)    DDD (degenerative disc disease), lumbar    DDD (degenerative disc disease), lumbar    Environmental allergies    Esophageal reflux    GERD (gastroesophageal reflux disease)    History of colonic polyps    History of kidney stones    Hyperlipidemia    Hypertension    Schatzki's ring     Current Outpatient Medications:    ciclopirox (PENLAC) 8 % solution, Apply topically at bedtime. Apply over nail and surrounding skin. Apply daily over previous coat. After seven (7) days, may remove with alcohol and continue cycle., Disp: 6.6 mL, Rfl: 0   acetaminophen (TYLENOL) 325 MG tablet, Take 325 mg by mouth daily., Disp: , Rfl:    celecoxib (CELEBREX) 200 MG capsule, Take 200 mg by mouth 2 (two) times daily. , Disp: , Rfl:    ciclopirox (PENLAC) 8 % solution, Apply topically at bedtime. Apply over nail and surrounding skin. Apply daily over previous coat. After seven (7) days, may remove with alcohol and continue cycle., Disp: 6.6 mL, Rfl: 0   LORazepam (ATIVAN) 1 MG tablet, Take 1 mg by mouth at bedtime as needed for anxiety or sleep. , Disp: , Rfl:    naproxen (NAPROSYN) 250 MG tablet, Take 1 tablet (250 mg total) by mouth 2 (two) times daily with a meal., Disp: 20 tablet, Rfl: 0   omeprazole (PRILOSEC) 40 MG capsule, Take 40 mg by mouth daily., Disp:  , Rfl: 3  Social History   Tobacco Use  Smoking Status Never  Smokeless Tobacco Current   Types: Chew    Allergies  Allergen Reactions   Penicillins Rash    Has patient had a PCN reaction causing immediate rash, facial/tongue/throat swelling, SOB or lightheadedness with hypotension: no Has patient had a PCN reaction causing severe rash involving mucus membranes or skin necrosis: no Has patient had a PCN reaction that required hospitalization no Has patient had a PCN reaction occurring within the last 10 years: no If all of the above answers are "NO", then may proceed with Cephalosporin use.  Has patient had a PCN reaction causing immediate rash, facial/tongue/throat swelling, SOB or lightheadedness with hypotension: no Has patient had a PCN reaction causing severe rash involving mucus membranes or skin necrosis: no Has patient had a PCN reaction that required hospitalization no Has patient had a PCN reaction occurring within the last 10 years: no If all of the above answers are "NO", then may proceed with Cephalosporin use.   Objective:  There were no vitals filed for this visit. There is no height or weight on file to calculate BMI. Constitutional Well developed. Well nourished.  Vascular Dorsalis pedis pulses palpable bilaterally. Posterior tibial pulses palpable bilaterally. Capillary refill normal to all digits.  No cyanosis or  clubbing noted. Pedal hair growth normal.  Neurologic Normal speech. Oriented to person, place, and time. Epicritic sensation to light touch grossly present bilaterally.  Dermatologic Nails thickened elongated dystrophic mycotic toenails x10 but worst being bilateral hallux and left second digit that is slowly improving mild pain on palpation Skin within normal limits  Orthopedic: Normal joint ROM without pain or crepitus bilaterally. No visible deformities. No bony tenderness.   Radiographs: None Assessment:   No diagnosis found.  Plan:   Patient was evaluated and treated and all questions answered.  Bilateral hallux and left second digit onychomycosis~improving -Educated the patient on the etiology of onychomycosis and various treatment options associated with improving the fungal load.  I explained to the patient that there is 3 treatment options available to treat the onychomycosis including topical, p.o., laser treatment.  Patient has elected to undergo topical application with Penlac.  I discussed with him that he would apply once a day for neck 6 to 8 months at minimum.  He can also do twice a day application.  I discussed this with the patient extensively he states understanding. -Refill for Penlac was sent to the pharmacy    No follow-ups on file.

## 2021-08-10 ENCOUNTER — Encounter: Payer: Self-pay | Admitting: Emergency Medicine

## 2021-08-10 ENCOUNTER — Emergency Department
Admission: EM | Admit: 2021-08-10 | Discharge: 2021-08-10 | Disposition: A | Discharge status: Home / Self Care | Payer: Medicare Other | Attending: Emergency Medicine | Admitting: Emergency Medicine

## 2021-08-10 ENCOUNTER — Other Ambulatory Visit: Payer: Self-pay

## 2021-08-10 DIAGNOSIS — B029 Zoster without complications: Secondary | ICD-10-CM | POA: Diagnosis not present

## 2021-08-10 DIAGNOSIS — R21 Rash and other nonspecific skin eruption: Secondary | ICD-10-CM | POA: Diagnosis present

## 2021-08-10 MED ORDER — VALACYCLOVIR HCL 1 G PO TABS: 1000.0000 mg | ORAL_TABLET | Freq: Three times a day (TID) | ORAL | 0 refills | Status: AC

## 2021-08-10 NOTE — ED Triage Notes (Signed)
Pt via POV from home. Pt c/o rash that is itchy and burning on his back that he noticed 5-6 days ago. Unknown if he got bit by anything. Pt is A&Ox4 and NAD

## 2021-08-10 NOTE — ED Provider Notes (Signed)
[facility] Provider Note  Patient Contact: 6:10 PM (approximate)   History   No chief complaint on file.   HPI  Robert Alexander is a 86 y.o. male presents to the emergency department with a burning rash that started 4 hours ago.  Triage note noted.  Patient states that affected area burns and it is in a dermatomal distribution.      Physical Exam   Triage Vital Signs: ED Triage Vitals  Enc Vitals Group     BP 08/10/21 1648 (!) 180/81     Pulse Rate 08/10/21 1648 73     Resp 08/10/21 1648 20     Temp 08/10/21 1648 98.4 F (36.9 C)     Temp Source 08/10/21 1648 Oral     SpO2 08/10/21 1648 96 %     Weight 08/10/21 1646 142 lb (64.4 kg)     Height 08/10/21 1646 5\' 4"  (1.626 m)     Head Circumference --      Peak Flow --      Pain Score 08/10/21 1646 4     Pain Loc --      Pain Edu? --      Excl. in GC? --     Most recent vital signs: Vitals:   08/10/21 1806 08/10/21 1807  BP:    Pulse: 73   Resp:  18  Temp:    SpO2: 97%      General: Alert and in no acute distress. Eyes:  PERRL. EOMI. Head: No acute traumatic findings ENT:      Nose: No congestion/rhinnorhea.      Mouth/Throat: Mucous membranes are moist. Neck: No stridor. No cervical spine tenderness to palpation. Cardiovascular:  Good peripheral perfusion Respiratory: Normal respiratory effort without tachypnea or retractions. Lungs CTAB. Good air entry to the bases with no decreased or absent breath sounds. Gastrointestinal: Bowel sounds 4 quadrants. Soft and nontender to palpation. No guarding or rigidity. No palpable masses. No distention. No CVA tenderness. Musculoskeletal: Full range of motion to all extremities.  Neurologic:  No gross focal neurologic deficits are appreciated.  Skin: Patient has clustered, erythematous rash with the appearance of new vesicles that are formed.   ED Results / Procedures / Treatments   Labs (all labs ordered are listed, but only abnormal  results are displayed) Labs Reviewed - No data to display     PROCEDURES:  Critical Care performed: No  Procedures   MEDICATIONS ORDERED IN ED: Medications - No data to display   IMPRESSION / MDM / ASSESSMENT AND PLAN / ED COURSE  I reviewed the triage vital signs and the nursing notes.                              Assessment and plan Shingles 86 year old male presents to the emergency department with a burning rash along the left low back in a dermatomal distribution.  Patient was hypertensive at triage but vital signs were otherwise reassuring.  Patient was alert, active and nontoxic-appearing.  We will treat with Valtrex patient follow-up with primary care.      FINAL CLINICAL IMPRESSION(S) / ED DIAGNOSES   Final diagnoses:  Herpes zoster without complication     Rx / DC Orders   ED Discharge Orders          Ordered    valACYclovir (VALTREX) 1000 MG tablet  3 times daily  08/10/21 1758             Note:  This document was prepared using Dragon voice recognition software and may include unintentional dictation errors.   Pia Mau Skidmore, PA-C 08/10/21 1813    Shaune Pollack, MD 08/10/21 2111

## 2021-08-10 NOTE — Discharge Instructions (Addendum)
Take 1 tablet 3 times daily for seven days.

## 2021-08-10 NOTE — ED Notes (Signed)
See triage note. Pt HOH; pt reports has shingles; pt calm and ambulatory in his room; steady; skin dry; resp reg/unlabored.

## 2021-08-23 ENCOUNTER — Telehealth: Payer: Self-pay

## 2021-08-23 NOTE — Telephone Encounter (Signed)
No additional notes needed  

## 2021-08-27 ENCOUNTER — Ambulatory Visit (INDEPENDENT_AMBULATORY_CARE_PROVIDER_SITE_OTHER): Payer: Medicare Other | Admitting: Podiatry

## 2021-08-27 DIAGNOSIS — B351 Tinea unguium: Secondary | ICD-10-CM

## 2021-08-27 DIAGNOSIS — M79675 Pain in left toe(s): Secondary | ICD-10-CM

## 2021-08-27 DIAGNOSIS — M79674 Pain in right toe(s): Secondary | ICD-10-CM

## 2021-08-27 NOTE — Progress Notes (Signed)
  Subjective:  Patient ID: Robert Alexander, male    DOB: 1935-08-10,  MRN: [id number]  Chief Complaint  Patient presents with   Nail Problem   86 y.o. male returns for the above complaint.  Patient presents with thickened elongated dystrophic toenails x10.  Patient would like to have them debrided down.  He states his second toe is causing him some pain.  He does not want to have it removed or does not want to do anything with it.  He would like his nails debrided down as it hurts with ambulation.  He denies any other acute complaints.  Objective:  There were no vitals filed for this visit. Podiatric Exam: Vascular: dorsalis pedis and posterior tibial pulses are palpable bilateral. Capillary return is immediate. Temperature gradient is WNL. Skin turgor WNL  Sensorium: Normal Semmes Weinstein monofilament test. Normal tactile sensation bilaterally. Nail Exam: Pt has thick disfigured discolored nails with subungual debris noted bilateral entire nail hallux through fifth toenails.  Pain on palpation to the nails. Ulcer Exam: There is no evidence of ulcer or pre-ulcerative changes or infection. Orthopedic Exam: Muscle tone and strength are WNL. No limitations in general ROM. No crepitus or effusions noted. HAV  B/L.  Hammer toes 2-5  B/L. Skin: No Porokeratosis. No infection or ulcers    Assessment & Plan:   1. Pain due to onychomycosis of toenails of both feet      Patient was evaluated and treated and all questions answered.  Onychomycosis with pain  -Nails palliatively debrided as below. -Educated on self-care  Procedure: Nail Debridement Rationale: pain  Type of Debridement: manual, sharp debridement. Instrumentation: Nail nipper, rotary burr. Number of Nails: 10  Procedures and Treatment: Consent by patient was obtained for treatment procedures. The patient understood the discussion of treatment and procedures well. All questions were answered thoroughly reviewed. Debridement of  mycotic and hypertrophic toenails, 1 through 5 bilateral and clearing of subungual debris. No ulceration, no infection noted.  Return Visit-Office Procedure: Patient instructed to return to the office for a follow up visit 3 months for continued evaluation and treatment.  Nicholes Rough, DPM    No follow-ups on file.

## 2021-09-05 ENCOUNTER — Ambulatory Visit: Payer: Medicare Other | Admitting: Podiatry

## 2021-09-26 ENCOUNTER — Ambulatory Visit (INDEPENDENT_AMBULATORY_CARE_PROVIDER_SITE_OTHER): Payer: Medicare Other | Admitting: Podiatry

## 2021-09-26 DIAGNOSIS — M79674 Pain in right toe(s): Secondary | ICD-10-CM

## 2021-09-26 DIAGNOSIS — M79675 Pain in left toe(s): Secondary | ICD-10-CM

## 2021-09-26 DIAGNOSIS — B351 Tinea unguium: Secondary | ICD-10-CM

## 2021-10-01 NOTE — Progress Notes (Signed)
  Subjective:  Patient ID: Robert Alexander, male    DOB: 01/27/1936,  MRN: [id number]  Chief Complaint  Patient presents with   Nail Problem    Nail trim    86 y.o. male returns for the above complaint.  Patient presents with thickened elongated dystrophic toenails x10.  Patient would like to have them debrided down.  He would like to have them debrided and he is unable to do it himself.  He denies any other acute complaints.  Objective:  There were no vitals filed for this visit. Podiatric Exam: Vascular: dorsalis pedis and posterior tibial pulses are palpable bilateral. Capillary return is immediate. Temperature gradient is WNL. Skin turgor WNL  Sensorium: Normal Semmes Weinstein monofilament test. Normal tactile sensation bilaterally. Nail Exam: Pt has thick disfigured discolored nails with subungual debris noted bilateral entire nail hallux through fifth toenails.  Pain on palpation to the nails. Ulcer Exam: There is no evidence of ulcer or pre-ulcerative changes or infection. Orthopedic Exam: Muscle tone and strength are WNL. No limitations in general ROM. No crepitus or effusions noted. HAV  B/L.  Hammer toes 2-5  B/L. Skin: No Porokeratosis. No infection or ulcers    Assessment & Plan:   1. Pain due to onychomycosis of toenails of both feet       Patient was evaluated and treated and all questions answered.  Onychomycosis with pain  -Nails palliatively debrided as below. -Educated on self-care  Procedure: Nail Debridement Rationale: pain  Type of Debridement: manual, sharp debridement. Instrumentation: Nail nipper, rotary burr. Number of Nails: 10  Procedures and Treatment: Consent by patient was obtained for treatment procedures. The patient understood the discussion of treatment and procedures well. All questions were answered thoroughly reviewed. Debridement of mycotic and hypertrophic toenails, 1 through 5 bilateral and clearing of subungual debris. No ulceration, no  infection noted.  Return Visit-Office Procedure: Patient instructed to return to the office for a follow up visit 3 months for continued evaluation and treatment.  Nicholes Rough, DPM    No follow-ups on file.

## 2021-11-26 ENCOUNTER — Ambulatory Visit: Payer: Medicare Other | Admitting: Podiatry

## 2021-12-09 ENCOUNTER — Encounter: Payer: Self-pay | Admitting: Gastroenterology

## 2021-12-09 ENCOUNTER — Ambulatory Visit (INDEPENDENT_AMBULATORY_CARE_PROVIDER_SITE_OTHER): Payer: Medicare Other | Admitting: Gastroenterology

## 2021-12-09 VITALS — BP 185/88 | HR 67 | Temp 97.7°F | Ht 67.0 in | Wt 130.0 lb

## 2021-12-09 DIAGNOSIS — R1310 Dysphagia, unspecified: Secondary | ICD-10-CM | POA: Diagnosis not present

## 2021-12-09 NOTE — Progress Notes (Signed)
Gastroenterology Consultation  Referring Provider:     Linus Salmons, MD Primary Care Physician:  Marguarite Arbour, MD Primary Gastroenterologist:  Dr. Servando Snare     Reason for Consultation:     Dysphagia        HPI:   Robert Alexander is a 86 y.o. y/o male referred for consultation & management of dysphagia by Dr. Judithann Sheen, Duane Lope, MD.   This patient comes in today with a history of dysphagia and having to have his esophagus stretched in the past.  The patient's original dilation appears to have been done by Dr. 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  The patient then had a dilation by Dr. Norma Fredrickson up to 18 mm in 2020.  The patient had been recently seen by ENT who recommended the patient come see me. He says the last EGD dilation was painful and he went to the ED. He went from 13 mm to 18 mm at that time. The patient did report that after the last EGD with dilation within the pain went away he felt much better with eating.  Past Medical History:  Diagnosis Date   Acute recurrent sinusitis    Anxiety    BPH (benign prostatic hyperplasia)    DDD (degenerative disc disease), lumbar    DDD (degenerative disc disease), lumbar    Environmental allergies    Esophageal reflux    GERD (gastroesophageal reflux disease)    History of colonic polyps    History of kidney stones    Hyperlipidemia    Hypertension    Schatzki's ring     Past Surgical History:  Procedure Laterality Date   COLONOSCOPY     COLONOSCOPY WITH PROPOFOL N/A 10/11/2018   Procedure: COLONOSCOPY WITH PROPOFOL;  Surgeon: Toledo, Boykin Nearing, MD;  Location: [facility] ENDOSCOPY;  Service: Gastroenterology;  Laterality: N/A;   ESOPHAGOGASTRODUODENOSCOPY (EGD) WITH PROPOFOL N/A 12/14/2017   Procedure: ESOPHAGOGASTRODUODENOSCOPY (EGD) WITH PROPOFOL;  Surgeon: Scot Jun, MD;  Location: [facility] ENDOSCOPY;  Service: Endoscopy;  Laterality: N/A;    ESOPHAGOGASTRODUODENOSCOPY (EGD) WITH PROPOFOL N/A 10/11/2018   Procedure: ESOPHAGOGASTRODUODENOSCOPY (EGD) WITH PROPOFOL;  Surgeon: Toledo, Boykin Nearing, MD;  Location: [facility] ENDOSCOPY;  Service: Gastroenterology;  Laterality: N/A;   HEMORRHOID SURGERY     HERNIA REPAIR     kidney stones     KNEE SURGERY     SHOULDER SURGERY      Prior to Admission medications   Medication Sig Start Date End Date Taking? Authorizing Provider  acetaminophen (TYLENOL) 325 MG tablet Take 325 mg by mouth daily.    [provider]  celecoxib (CELEBREX) 200 MG capsule Take 200 mg by mouth 2 (two) times daily.     [provider]  ciclopirox (PENLAC) 8 % solution Apply topically at bedtime. Apply over nail and surrounding skin. Apply daily over previous coat. After seven (7) days, may remove with alcohol and continue cycle. 01/24/21   Candelaria Stagers, DPM  ciclopirox (PENLAC) 8 % solution Apply topically at bedtime. Apply over nail and surrounding skin. Apply daily over previous coat. After seven (7) days, may remove with alcohol and continue cycle. 01/24/21   Candelaria Stagers, DPM  LORazepam (ATIVAN) 1 MG tablet Take 1 mg by mouth at bedtime as needed for anxiety or sleep.     [provider]  naproxen (NAPROSYN) 250 MG tablet Take 1 tablet (250 mg total) by  mouth 2 (two) times daily with a meal. 07/16/19   Jene Every, MD  omeprazole (PRILOSEC) 40 MG capsule Take 40 mg by mouth daily. 01/22/17   [provider]    No family history on file.   Social History   Tobacco Use   Smoking status: Never   Smokeless tobacco: Current    Types: Chew  Vaping Use   Vaping Use: Never used  Substance Use Topics   Alcohol use: No   Drug use: Never    Allergies as of 12/09/2021 - Review Complete 09/26/2021  Allergen Reaction Noted   Penicillins Rash 03/31/2013    Review of Systems:    All systems reviewed and negative except where noted in HPI.   Physical Exam:  There were no  vitals taken for this visit. No LMP for male patient. General:   Alert,  Well-developed, well-nourished, pleasant and cooperative in NAD Head:  Normocephalic and atraumatic. Eyes:  Sclera clear, no icterus.   Conjunctiva pink. Ears:  Normal auditory acuity. Neck:  Supple; no masses or thyromegaly. Lungs:  Respirations even and unlabored.  Clear throughout to auscultation.   No wheezes, crackles, or rhonchi. No acute distress. Heart:  Regular rate and rhythm; no murmurs, clicks, rubs, or gallops. Abdomen:  Normal bowel sounds.  No bruits.  Soft, non-tender and non-distended without masses, hepatosplenomegaly or hernias noted.  No guarding or rebound tenderness.  Negative Carnett sign.   Rectal:  Deferred.  Pulses:  Normal pulses noted. Extremities:  No clubbing or edema.  No cyanosis. Neurologic:  Alert and oriented x3;  grossly normal neurologically. Skin:  Intact without significant lesions or rashes.  No jaundice. Lymph Nodes:  No significant cervical adenopathy. Psych:  Alert and cooperative. Normal mood and affect.  Imaging Studies: No results found.  Assessment and Plan:   Robert Alexander is a 86 y.o. y/o male who comes in today with a history of dysphagia with esophageal dilation in the past.  The patient is now having trouble swallowing again. The patient will be set up for a EGD and possible dilation.  The patient has been told that due to to his past experience with pain after dilation we would only dilate 3 sizes above when he presently is at to decrease the risk of perforation and postprocedure pain.  The patient and his wife have explained the plan and agrees with it.    Midge Minium, MD. Clementeen Graham    Note: This dictation was prepared with Dragon dictation along with smaller phrase technology. Any transcriptional errors that result from this process are unintentional.

## 2021-12-11 ENCOUNTER — Telehealth: Payer: Self-pay

## 2021-12-11 NOTE — Telephone Encounter (Signed)
Left message on voicemail.

## 2021-12-11 NOTE — Telephone Encounter (Signed)
Wife Robert Alexander lmovm requesting call back to r/s procedure  Called and lmovm for pt to r/t my call

## 2021-12-12 NOTE — Telephone Encounter (Signed)
Left message on voicemail.

## 2021-12-17 NOTE — Telephone Encounter (Signed)
Left message on voicemail.

## 2021-12-17 NOTE — Telephone Encounter (Signed)
Pt has been rescheduled 01/06/2022 in [place]

## 2021-12-30 ENCOUNTER — Encounter: Payer: Self-pay | Admitting: Gastroenterology

## 2022-01-30 ENCOUNTER — Encounter: Payer: Self-pay | Admitting: Gastroenterology

## 2022-02-07 ENCOUNTER — Other Ambulatory Visit: Payer: Self-pay

## 2022-02-07 ENCOUNTER — Ambulatory Visit
Admission: RE | Admit: 2022-02-07 | Discharge: 2022-02-07 | Disposition: A | Discharge status: Home / Self Care | Payer: Medicare Other | Attending: Gastroenterology | Admitting: Gastroenterology

## 2022-02-07 ENCOUNTER — Ambulatory Visit: Payer: Medicare Other | Admitting: Anesthesiology

## 2022-02-07 ENCOUNTER — Encounter: Payer: Self-pay | Admitting: Gastroenterology

## 2022-02-07 ENCOUNTER — Ambulatory Visit
Admission: RE | Disposition: A | Discharge status: Home / Self Care | Payer: Self-pay | Source: Home / Self Care | Attending: Gastroenterology

## 2022-02-07 DIAGNOSIS — F419 Anxiety disorder, unspecified: Secondary | ICD-10-CM | POA: Diagnosis not present

## 2022-02-07 DIAGNOSIS — I10 Essential (primary) hypertension: Secondary | ICD-10-CM | POA: Insufficient documentation

## 2022-02-07 DIAGNOSIS — K219 Gastro-esophageal reflux disease without esophagitis: Secondary | ICD-10-CM | POA: Insufficient documentation

## 2022-02-07 DIAGNOSIS — K222 Esophageal obstruction: Secondary | ICD-10-CM | POA: Diagnosis not present

## 2022-02-07 DIAGNOSIS — Z87442 Personal history of urinary calculi: Secondary | ICD-10-CM | POA: Diagnosis not present

## 2022-02-07 DIAGNOSIS — R1310 Dysphagia, unspecified: Secondary | ICD-10-CM | POA: Diagnosis not present

## 2022-02-07 DIAGNOSIS — N400 Enlarged prostate without lower urinary tract symptoms: Secondary | ICD-10-CM | POA: Diagnosis not present

## 2022-02-07 HISTORY — PX: ESOPHAGOGASTRODUODENOSCOPY (EGD) WITH PROPOFOL: SHX5813

## 2022-02-07 SURGERY — ESOPHAGOGASTRODUODENOSCOPY (EGD) WITH PROPOFOL
Anesthesia: General | Site: Mouth

## 2022-02-07 MED ORDER — PROPOFOL 10 MG/ML IV BOLUS
INTRAVENOUS | Status: DC | PRN
  Administered 2022-02-07: 100 mg via INTRAVENOUS

## 2022-02-07 MED ORDER — LIDOCAINE HCL (CARDIAC) PF 100 MG/5ML IV SOSY
PREFILLED_SYRINGE | INTRAVENOUS | Status: DC | PRN
  Administered 2022-02-07: 50 mg via INTRAVENOUS

## 2022-02-07 MED ORDER — SODIUM CHLORIDE 0.9 % IV SOLN: INTRAVENOUS | Status: DC

## 2022-02-07 MED ORDER — LACTATED RINGERS IV SOLN: INTRAVENOUS | Status: DC

## 2022-02-07 SURGICAL SUPPLY — 32 items
BALLN DILATOR 12-15 8 (BALLOONS) ×2
BALLN DILATOR 15-18 8 (BALLOONS)
BALLN DILATOR CRE 0-12 8 (BALLOONS)
BALLN DILATOR ESOPH 8 10 CRE (MISCELLANEOUS) IMPLANT
BALLOON DILATOR 12-15 8 (BALLOONS) IMPLANT
BALLOON DILATOR 15-18 8 (BALLOONS) IMPLANT
BALLOON DILATOR CRE 0-12 8 (BALLOONS) IMPLANT
BLOCK BITE 60FR ADLT L/F GRN (MISCELLANEOUS) ×2 IMPLANT
CLIP HMST 235XBRD CATH ROT (MISCELLANEOUS) IMPLANT
CLIP RESOLUTION 360 11X235 (MISCELLANEOUS)
ELECT REM PT RETURN 9FT ADLT (ELECTROSURGICAL)
ELECTRODE REM PT RTRN 9FT ADLT (ELECTROSURGICAL) IMPLANT
FCP ESCP3.2XJMB 240X2.8X (MISCELLANEOUS)
FORCEPS BIOP RAD 4 LRG CAP 4 (CUTTING FORCEPS) IMPLANT
FORCEPS BIOP RJ4 240 W/NDL (MISCELLANEOUS)
FORCEPS ESCP3.2XJMB 240X2.8X (MISCELLANEOUS) IMPLANT
GOWN CVR UNV OPN BCK APRN NK (MISCELLANEOUS) ×4 IMPLANT
GOWN ISOL THUMB LOOP REG UNIV (MISCELLANEOUS) ×4
INJECTOR VARIJECT VIN23 (MISCELLANEOUS) IMPLANT
KIT DEFENDO VALVE AND CONN (KITS) IMPLANT
KIT PRC NS LF DISP ENDO (KITS) ×2 IMPLANT
KIT PROCEDURE OLYMPUS (KITS) ×2
MANIFOLD NEPTUNE II (INSTRUMENTS) ×2 IMPLANT
MARKER SPOT ENDO TATTOO 5ML (MISCELLANEOUS) IMPLANT
RETRIEVER NET PLAT FOOD (MISCELLANEOUS) IMPLANT
SNARE SHORT THROW 13M SML OVAL (MISCELLANEOUS) IMPLANT
SNARE SHORT THROW 30M LRG OVAL (MISCELLANEOUS) IMPLANT
SYR INFLATION 60ML (SYRINGE) IMPLANT
TRAP ETRAP POLY (MISCELLANEOUS) IMPLANT
VARIJECT INJECTOR VIN23 (MISCELLANEOUS)
WATER STERILE IRR 250ML POUR (IV SOLUTION) ×2 IMPLANT
WIRE CRE 18-20MM 8CM F G (MISCELLANEOUS) IMPLANT

## 2022-02-07 NOTE — Transfer of Care (Signed)
Immediate Anesthesia Transfer of Care Note  Patient: Robert Alexander  Procedure(s) Performed: ESOPHAGOGASTRODUODENOSCOPY (EGD) WITH PROPOFOL (Mouth) Balloon dilation wire-guided (Mouth)  Patient Location: PACU  Anesthesia Type: General  Level of Consciousness: awake, alert  and patient cooperative  Airway and Oxygen Therapy: Patient Spontanous Breathing and Patient connected to supplemental oxygen  Post-op Assessment: Post-op Vital signs reviewed, Patient's Cardiovascular Status Stable, Respiratory Function Stable, Patent Airway and No signs of Nausea or vomiting  Post-op Vital Signs: Reviewed and stable  Complications: No notable events documented.

## 2022-02-07 NOTE — Anesthesia Preprocedure Evaluation (Addendum)
Anesthesia Evaluation  Patient identified by MRN, date of birth, ID band Patient awake    Reviewed: Allergy & Precautions, NPO status , Patient's Chart, lab work & pertinent test results  History of Anesthesia Complications Negative for: history of anesthetic complications  Airway Mallampati: IV   Neck ROM: Full    Dental  (+) Missing   Pulmonary neg pulmonary ROS   Pulmonary exam normal breath sounds clear to auscultation       Cardiovascular hypertension, Normal cardiovascular exam Rhythm:Regular Rate:Normal  Echo 12/06/18:  NORMAL LEFT VENTRICULAR SYSTOLIC FUNCTION  NORMAL RIGHT VENTRICULAR SYSTOLIC FUNCTION  MILD VALVULAR REGURGITATION  NO VALVULAR STENOSIS  Mild MVP with mild MR    Neuro/Psych  PSYCHIATRIC DISORDERS Anxiety     HOH    GI/Hepatic ,GERD  ,,  Endo/Other  negative endocrine ROS    Renal/GU Renal disease (nephrolithiasis)   BPH    Musculoskeletal  (+) Arthritis ,    Abdominal   Peds  Hematology negative hematology ROS (+)   Anesthesia Other Findings   Reproductive/Obstetrics                             Anesthesia Physical Anesthesia Plan  ASA: 2  Anesthesia Plan: General   Post-op Pain Management:    Induction: Intravenous  PONV Risk Score and Plan: 2 and Propofol infusion, TIVA and Treatment may vary due to age or medical condition  Airway Management Planned: Natural Airway  Additional Equipment:   Intra-op Plan:   Post-operative Plan:   Informed Consent: I have reviewed the patients History and Physical, chart, labs and discussed the procedure including the risks, benefits and alternatives for the proposed anesthesia with the patient or authorized representative who has indicated his/her understanding and acceptance.       Plan Discussed with: CRNA  Anesthesia Plan Comments: (LMA/GETA backup discussed.  Patient consented for risks of anesthesia  including but not limited to:  - adverse reactions to medications - damage to eyes, teeth, lips or other oral mucosa - nerve damage due to positioning  - sore throat or hoarseness - damage to heart, brain, nerves, lungs, other parts of body or loss of life  Informed patient about role of CRNA in peri- and intra-operative care.  Patient voiced understanding.)        Anesthesia Quick Evaluation

## 2022-02-07 NOTE — Anesthesia Postprocedure Evaluation (Signed)
Anesthesia Post Note  Patient: Robert Alexander  Procedure(s) Performed: ESOPHAGOGASTRODUODENOSCOPY (EGD) WITH PROPOFOL (Mouth) Balloon dilation wire-guided (Mouth)  Patient location during evaluation: PACU Anesthesia Type: General Level of consciousness: awake and alert, oriented and patient cooperative Pain management: pain level controlled Vital Signs Assessment: post-procedure vital signs reviewed and stable Respiratory status: spontaneous breathing, nonlabored ventilation and respiratory function stable Cardiovascular status: blood pressure returned to baseline and stable Postop Assessment: adequate PO intake Anesthetic complications: no   No notable events documented.   Last Vitals:  Vitals:   02/07/22 1011 02/07/22 1015  BP: 125/66 128/70  Pulse: 62 65  Resp:  16  Temp: (!) 36.3 C   SpO2: 96% 97%    Last Pain:  Vitals:   02/07/22 1015  TempSrc:   PainSc: 0-No pain                 Darrin Nipper

## 2022-02-07 NOTE — Op Note (Signed)
[facility] Gastroenterology Patient Name: Robert Alexander Procedure Date: 02/07/2022 9:49 AM MRN: [id number] Account #: [id number] Date of Birth: 1935/12/25 Admit Type: Outpatient Age: 87 Room: [facility] OR ROOM 01 Gender: Male Note Status: Finalized Instrument Name: [id number] Procedure:             Upper GI endoscopy Indications:           Dysphagia Providers:             Lucilla Lame MD, MD Referring MD:          Leonie Douglas. Doy Hutching, MD (Referring MD) Medicines:             Propofol per Anesthesia Complications:         No immediate complications. Procedure:             Pre-Anesthesia Assessment:                        - Prior to the procedure, a History and Physical was                         performed, and patient medications and allergies were                         reviewed. The patient's tolerance of previous                         anesthesia was also reviewed. The risks and benefits                         of the procedure and the sedation options and risks                         were discussed with the patient. All questions were                         answered, and informed consent was obtained. Prior                         Anticoagulants: The patient has taken no anticoagulant                         or antiplatelet agents. ASA Grade Assessment: II - A                         patient with mild systemic disease. After reviewing                         the risks and benefits, the patient was deemed in                         satisfactory condition to undergo the procedure.                        After obtaining informed consent, the endoscope was                         passed under direct vision. Throughout the procedure,  the patient's blood pressure, pulse, and oxygen                         saturations were monitored continuously. The Endoscope                         was introduced through the mouth, and advanced to the                          second part of duodenum. The upper GI endoscopy was                         accomplished without difficulty. The patient tolerated                         the procedure well. Findings:      One benign-appearing, intrinsic moderate stenosis was found at the       gastroesophageal junction. The stenosis was traversed. A TTS dilator was       passed through the scope. Dilation with a 12-13.5-15 mm balloon dilator       was performed to 15 mm. The dilation site was examined following       endoscope reinsertion and showed moderate improvement in luminal       narrowing.      The stomach was normal.      The examined duodenum was normal. Impression:            - Benign-appearing esophageal stenosis. Dilated.                        - Normal stomach.                        - Normal examined duodenum.                        - No specimens collected. Recommendation:        - Discharge patient to home.                        - Resume previous diet.                        - Continue present medications. Procedure Code(s):     --- Professional ---                        [phone], Esophagogastroduodenoscopy, flexible,                         transoral; with transendoscopic balloon dilation of                         esophagus (less than 30 mm diameter) Diagnosis Code(s):     --- Professional ---                        R13.10, Dysphagia, unspecified                        K22.2, Esophageal obstruction CPT copyright 2022 American Medical Association. All rights reserved. The codes documented in this  report are preliminary and upon coder review may  be revised to meet current compliance requirements. Midge Minium MD, MD 02/07/2022 10:07:01 AM This report has been signed electronically. Number of Addenda: 0 Note Initiated On: 02/07/2022 9:49 AM Estimated Blood Loss:  Estimated blood loss: none.      [facility]

## 2022-02-07 NOTE — H&P (Signed)
Lucilla Lame, MD [facility] [address], [place], [place] [address] Phone:[phone] Fax : [phone]  Primary Care Physician:  Idelle Crouch, MD Primary Gastroenterologist:  Dr. Allen Norris  Pre-Procedure History & Physical: HPI:  Robert Alexander is a 87 y.o. male is here for an endoscopy.   Past Medical History:  Diagnosis Date   Acute recurrent sinusitis    Anxiety    BPH (benign prostatic hyperplasia)    DDD (degenerative disc disease), lumbar    DDD (degenerative disc disease), lumbar    Environmental allergies    Esophageal reflux    GERD (gastroesophageal reflux disease)    History of colonic polyps    History of kidney stones    Hyperlipidemia    Hypertension    Schatzki's ring     Past Surgical History:  Procedure Laterality Date   CATARACT EXTRACTION W/ INTRAOCULAR LENS IMPLANT Bilateral    01/15/22, 01/29/22  [facility]   COLONOSCOPY     COLONOSCOPY WITH PROPOFOL N/A 10/11/2018   Procedure: COLONOSCOPY WITH PROPOFOL;  Surgeon: Toledo, Benay Pike, MD;  Location: [facility] ENDOSCOPY;  Service: Gastroenterology;  Laterality: N/A;   ESOPHAGOGASTRODUODENOSCOPY (EGD) WITH PROPOFOL N/A 12/14/2017   Procedure: ESOPHAGOGASTRODUODENOSCOPY (EGD) WITH PROPOFOL;  Surgeon: Manya Silvas, MD;  Location: [facility] ENDOSCOPY;  Service: Endoscopy;  Laterality: N/A;   ESOPHAGOGASTRODUODENOSCOPY (EGD) WITH PROPOFOL N/A 10/11/2018   Procedure: ESOPHAGOGASTRODUODENOSCOPY (EGD) WITH PROPOFOL;  Surgeon: Toledo, Benay Pike, MD;  Location: [facility] ENDOSCOPY;  Service: Gastroenterology;  Laterality: N/A;   HEMORRHOID SURGERY     HERNIA REPAIR     kidney stones     KNEE SURGERY     SHOULDER SURGERY      Prior to Admission medications   Medication Sig Start Date End Date Taking? Authorizing Provider  acetaminophen (TYLENOL) 325 MG tablet Take 325 mg by mouth daily.   Yes [provider]  amLODipine (NORVASC) 5 MG tablet Take 5 mg by mouth daily.   Yes [provider]   Cyanocobalamin (VITAMIN B-12 PO) Take by mouth daily.   Yes [provider]  LORazepam (ATIVAN) 1 MG tablet Take 1 mg by mouth at bedtime as needed for anxiety or sleep.    Yes [provider]  omeprazole (PRILOSEC) 40 MG capsule Take 40 mg by mouth daily. 01/22/17  Yes [provider]  celecoxib (CELEBREX) 200 MG capsule Take 200 mg by mouth 2 (two) times daily.  Patient not taking: Reported on 12/30/2021    [provider]  naproxen (NAPROSYN) 250 MG tablet Take 1 tablet (250 mg total) by mouth 2 (two) times daily with a meal. Patient not taking: Reported on 12/30/2021 07/16/19   Lavonia Drafts, MD    Allergies as of 12/09/2021 - Review Complete 12/09/2021  Allergen Reaction Noted   Penicillins Rash 03/31/2013    History reviewed. No pertinent family history.  Social History   Socioeconomic History   Marital status: Married    Spouse name: Not on file   Number of children: Not on file   Years of education: Not on file   Highest education level: Not on file  Occupational History   Not on file  Tobacco Use   Smoking status: Never   Smokeless tobacco: Current    Types: Chew  Vaping Use   Vaping Use: Never used  Substance and Sexual Activity   Alcohol use: No   Drug use: Never   Sexual activity: Not on file  Other Topics Concern   Not  on file  Social History Narrative   Not on file   Social Determinants of Health   Financial Resource Strain: Not on file  Food Insecurity: Not on file  Transportation Needs: Not on file  Physical Activity: Not on file  Stress: Not on file  Social Connections: Not on file  Intimate Partner Violence: Not on file    Review of Systems: See HPI, otherwise negative ROS  Physical Exam: BP (!) 165/67   Pulse 65   Temp (!) 97.3 F (36.3 C) (Temporal)   Resp 16   Ht 5\' 7"  (1.702 m)   Wt 59.4 kg   SpO2 97%   BMI 20.52 kg/m  General:   Alert,  pleasant and cooperative in NAD Head:  Normocephalic and  atraumatic. Neck:  Supple; no masses or thyromegaly. Lungs:  Clear throughout to auscultation.    Heart:  Regular rate and rhythm. Abdomen:  Soft, nontender and nondistended. Normal bowel sounds, without guarding, and without rebound.   Neurologic:  Alert and  oriented x4;  grossly normal neurologically.  Impression/Plan: Robert Alexander is here for an endoscopy to be performed for dysphagia  Risks, benefits, limitations, and alternatives regarding  endoscopy have been reviewed with the patient.  Questions have been answered.  All parties agreeable.   Lucilla Lame, MD  02/07/2022, 9:19 AM

## 2022-02-10 ENCOUNTER — Encounter: Payer: Self-pay | Admitting: Gastroenterology

## 2022-06-03 ENCOUNTER — Other Ambulatory Visit: Payer: Self-pay

## 2022-06-03 ENCOUNTER — Emergency Department
Admission: EM | Admit: 2022-06-03 | Discharge: 2022-06-03 | Disposition: A | Discharge status: Home / Self Care | Payer: Medicare Other | Attending: Emergency Medicine | Admitting: Emergency Medicine

## 2022-06-03 DIAGNOSIS — I10 Essential (primary) hypertension: Secondary | ICD-10-CM | POA: Insufficient documentation

## 2022-06-03 DIAGNOSIS — W228XXA Striking against or struck by other objects, initial encounter: Secondary | ICD-10-CM | POA: Insufficient documentation

## 2022-06-03 DIAGNOSIS — S61411A Laceration without foreign body of right hand, initial encounter: Secondary | ICD-10-CM | POA: Diagnosis not present

## 2022-06-03 DIAGNOSIS — S6991XA Unspecified injury of right wrist, hand and finger(s), initial encounter: Secondary | ICD-10-CM | POA: Diagnosis present

## 2022-06-03 NOTE — ED Triage Notes (Signed)
Pt comes with c/o rightg hand injury. Pt states he cut it on a door. Pt has bandage in place and bleeding controlled. Pt is not on thinners.

## 2022-06-03 NOTE — ED Provider Notes (Signed)
[facility] Provider Note    Event Date/Time   First MD Initiated Contact with Patient 06/03/22 1225     (approximate)   History   Hand Injury   HPI  Robert Alexander is a 87 y.o. male with history of hypertension, BPH, GERD, hyperlipidemia, and as listed in EMR presents to the emergency department for treatment and evaluation of skin tear to the right hand.  He hit it on the garage door this morning.  He washed it out and bandaged it.  Tdap is current.      Physical Exam   Triage Vital Signs: ED Triage Vitals  Enc Vitals Group     BP 06/03/22 1128 (!) 162/94     Pulse Rate 06/03/22 1128 83     Resp 06/03/22 1128 18     Temp 06/03/22 1128 98 F (36.7 C)     Temp src --      SpO2 06/03/22 1128 100 %     Weight --      Height --      Head Circumference --      Peak Flow --      Pain Score 06/03/22 1127 3     Pain Loc --      Pain Edu? --      Excl. in GC? --     Most recent vital signs: Vitals:   06/03/22 1128  BP: (!) 162/94  Pulse: 83  Resp: 18  Temp: 98 F (36.7 C)  SpO2: 100%    General: Awake, no distress.  CV:  Good peripheral perfusion.  Resp:  Normal effort.  Abd:  No distention.  Other:  Skin tear dorsal aspect of the right hand without active bleeding.   ED Results / Procedures / Treatments   Labs (all labs ordered are listed, but only abnormal results are displayed) Labs Reviewed - No data to display   EKG  Not indicated.   RADIOLOGY  Not indicated.  PROCEDURES:  Critical Care performed: No  ..Laceration Repair  Date/Time: 06/03/2022 7:45 PM  Performed by: Chinita Pester, FNP Authorized by: Chinita Pester, FNP   Consent:    Consent obtained:  Verbal   Consent given by:  Patient   Risks discussed:  Infection, poor cosmetic result and poor wound healing Universal protocol:    Patient identity confirmed:  Verbally with patient Anesthesia:    Anesthesia method:  None Laceration details:     Location:  Hand   Hand location:  R hand, dorsum   Length (cm):  8 Treatment:    Area cleansed with:  Chlorhexidine and saline   Irrigation method:  Tap Skin repair:    Repair method:  Tissue adhesive and Steri-Strips   Number of Steri-Strips:  6 Repair type:    Repair type:  Simple Post-procedure details:    Dressing:  Non-adherent dressing   Procedure completion:  Tolerated well, no immediate complications    MEDICATIONS ORDERED IN ED:  Medications - No data to display   IMPRESSION / MDM / ASSESSMENT AND PLAN / ED COURSE   I have reviewed the triage note.  Differential diagnosis includes, but is not limited to, laceration, skin tear  Patient's presentation is most consistent with acute, uncomplicated illness.  87 year old male presenting to the emergency department for treatment and evaluation of skin tear to the back of his right hand.  See HPI for further details.  Wound cleaned and repaired as described above.  Wound care discussed and patient was discharged home with instruction to follow-up with his primary care provider for any sign or concern of infection or if not healing well.      FINAL CLINICAL IMPRESSION(S) / ED DIAGNOSES   Final diagnoses:  Skin tear of hand without complication, right, initial encounter     Rx / DC Orders   ED Discharge Orders     None        Note:  This document was prepared using Dragon voice recognition software and may include unintentional dictation errors.   Chinita Pester, FNP 06/03/22 1946    Corena Herter, MD 06/06/22 [phone]

## 2022-07-19 ENCOUNTER — Emergency Department: Payer: Medicare Other

## 2022-07-19 ENCOUNTER — Emergency Department
Admission: EM | Admit: 2022-07-19 | Discharge: 2022-07-19 | Disposition: A | Discharge status: Home / Self Care | Payer: Medicare Other | Attending: Student in an Organized Health Care Education/Training Program | Admitting: Student in an Organized Health Care Education/Training Program

## 2022-07-19 ENCOUNTER — Other Ambulatory Visit: Payer: Self-pay

## 2022-07-19 DIAGNOSIS — S161XXA Strain of muscle, fascia and tendon at neck level, initial encounter: Secondary | ICD-10-CM | POA: Insufficient documentation

## 2022-07-19 DIAGNOSIS — W228XXA Striking against or struck by other objects, initial encounter: Secondary | ICD-10-CM | POA: Insufficient documentation

## 2022-07-19 DIAGNOSIS — I10 Essential (primary) hypertension: Secondary | ICD-10-CM | POA: Insufficient documentation

## 2022-07-19 DIAGNOSIS — S199XXA Unspecified injury of neck, initial encounter: Secondary | ICD-10-CM | POA: Diagnosis present

## 2022-07-19 MED ORDER — TRAMADOL HCL 50 MG PO TABS: 50.0000 mg | ORAL_TABLET | Freq: Four times a day (QID) | ORAL | 0 refills | Status: AC | PRN

## 2022-07-19 NOTE — Discharge Instructions (Signed)
Be aware the Tramadol can make you dizzy or drowsy. 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  You may purchase Lidocaine patches at the pharmacy.  Use Ice off and on over the next few days.   Follow up with primary care if your pain isn't improving over the week.
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
## 2022-07-19 NOTE — ED Provider Notes (Signed)
[facility] Provider Note    Event Date/Time   First MD Initiated Contact with Patient 07/19/22 [phone]     (approximate)   History   Facial Injury   HPI  Robert Alexander is a 87 y.o. male with history of degenerative disc disease, GERD, hypertension, osteoarthritis and as listed in EMR presents to the emergency department for treatment and evaluation of left side neck pain that has been present for the past 2 days.  He was trying to put a 50 pound bag of corn on the hand truck. The bottom plate was not flat when the bag landed and the handle came forward and hit his neck. Pain on the left side with turning his head or when he goes to lay down       Physical Exam   Triage Vital Signs: ED Triage Vitals  Enc Vitals Group     BP 07/19/22 0325 (!) 152/72     Pulse Rate 07/19/22 0325 60     Resp 07/19/22 0325 16     Temp 07/19/22 0325 97.9 F (36.6 C)     Temp Source 07/19/22 0325 Oral     SpO2 07/19/22 0325 99 %     Weight 07/19/22 0322 139 lb (63 kg)     Height 07/19/22 0322 5\' 6"  (1.676 m)     Head Circumference --      Peak Flow --      Pain Score 07/19/22 0321 10     Pain Loc --      Pain Edu? --      Excl. in GC? --     Most recent vital signs: Vitals:   07/19/22 0325 07/19/22 0831  BP: (!) 152/72 (!) 148/62  Pulse: 60 68  Resp: 16 17  Temp: 97.9 F (36.6 C)   SpO2: 99% 99%    General: Awake, no distress.  CV:  Good peripheral perfusion.  Resp:  Normal effort.  Abd:  No distention.  Other:  Focal tenderness over left lateral aspect of neck   ED Results / Procedures / Treatments   Labs (all labs ordered are listed, but only abnormal results are displayed) Labs Reviewed - No data to display   EKG  Not indicated.    RADIOLOGY  Image and radiology report reviewed and interpreted by me. Radiology report consistent with the same.  CT maxillofacial bones and cervical spine negative for acute  findings.  PROCEDURES:  Critical Care performed: No  Procedures   MEDICATIONS ORDERED IN ED:  Medications - No data to display   IMPRESSION / MDM / ASSESSMENT AND PLAN / ED COURSE   I have reviewed the triage note.  Differential diagnosis includes, but is not limited to, Muscle strain, contusion, fracture.  Patient's presentation is most consistent with acute illness / injury with system symptoms.  87 year old male presenting to the emergency department for treatment and evaluation of left side neck pain.  See HPI for further details.  CT imaging while awaiting ER room assignment was of the maxillofacial bones.  No fractures were identified.  On my exam he had no maxillofacial tenderness however he did have some tenderness over the left side of his neck and CT imaging of the cervical spine was obtained.  Both CT scans were negative.  He will be treated for musculoskeletal pain.  He states he has used over-the-counter medications with that have not worked.He was also advised to use ice and purchase lidocaine patches which  may also help.  Will give him a short course of tramadol.  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  He was encouraged to follow-up with his primary care provider if not improving over the week.      FINAL CLINICAL IMPRESSION(S) / ED DIAGNOSES   Final diagnoses:  Strain of neck muscle, initial encounter     Rx / DC Orders   ED Discharge Orders          Ordered    traMADol (ULTRAM) 50 MG tablet  Every 6 hours PRN        07/19/22 0825             Note:  This document was prepared using Dragon voice recognition software and may include unintentional dictation errors.   Chinita Pester, FNP 07/19/22 1007    Willy Eddy, MD 07/19/22 [phone]

## 2022-09-02 ENCOUNTER — Ambulatory Visit (INDEPENDENT_AMBULATORY_CARE_PROVIDER_SITE_OTHER): Payer: Medicare Other | Admitting: Podiatry

## 2022-09-02 DIAGNOSIS — L600 Ingrowing nail: Secondary | ICD-10-CM

## 2022-09-02 NOTE — Patient Instructions (Signed)
Place 1/4 cup of epsom salts in a quart of warm tap water.  Submerge your foot or feet in the solution and soak for 20 minutes.  This soak should be done twice a day for a week.  Next, remove your foot or feet from solution, blot dry the affected area. Apply ointment and cover if instructed by your doctor.   IF YOUR SKIN BECOMES IRRITATED WHILE USING THESE INSTRUCTIONS, IT IS OKAY TO SWITCH TO  WHITE VINEGAR AND WATER.  As another alternative soak, you may use antibacterial soap and water.  Monitor for any signs/symptoms of infection. Call the office immediately if any occur or go directly to the emergency room. Call with any questions/concerns.

## 2022-09-02 NOTE — Progress Notes (Signed)
Subjective:  Patient ID: Robert Alexander, male    DOB: [birth year],  MRN: [id number]  Chief Complaint  Patient presents with   Nail Problem    87 y.o. male presents with the above complaint.  Patient presents with thickened elongated dystrophic mycotic toenails x 2.  He would like to have it removed he does not want to make it permanent.  He denies any other acute complaints.  He would like to have it removed he denies any other acute complaints   Review of Systems: Negative except as noted in the HPI. Denies N/V/F/Ch.  Past Medical History:  Diagnosis Date   Acute recurrent sinusitis    Anxiety    BPH (benign prostatic hyperplasia)    DDD (degenerative disc disease), lumbar    DDD (degenerative disc disease), lumbar    Environmental allergies    Esophageal reflux    GERD (gastroesophageal reflux disease)    History of colonic polyps    History of kidney stones    Hyperlipidemia    Hypertension    Schatzki's ring     Current Outpatient Medications:    acetaminophen (TYLENOL) 325 MG tablet, Take 325 mg by mouth daily., Disp: , Rfl:    amLODipine (NORVASC) 5 MG tablet, Take 5 mg by mouth daily., Disp: , Rfl:    celecoxib (CELEBREX) 200 MG capsule, Take 200 mg by mouth 2 (two) times daily.  (Patient not taking: Reported on 12/30/2021), Disp: , Rfl:    Cyanocobalamin (VITAMIN B-12 PO), Take by mouth daily., Disp: , Rfl:    LORazepam (ATIVAN) 1 MG tablet, Take 1 mg by mouth at bedtime as needed for anxiety or sleep. , Disp: , Rfl:    omeprazole (PRILOSEC) 40 MG capsule, Take 40 mg by mouth daily., Disp: , Rfl: 3   traMADol (ULTRAM) 50 MG tablet, Take 1 tablet (50 mg total) by mouth every 6 (six) hours as needed., Disp: 12 tablet, Rfl: 0  Social History   Tobacco Use  Smoking Status Never  Smokeless Tobacco Current   Types: Chew    Allergies  Allergen Reactions   Penicillins Rash    Has patient had a PCN reaction causing immediate rash, facial/tongue/throat swelling, SOB or  lightheadedness with hypotension: no Has patient had a PCN reaction causing severe rash involving mucus membranes or skin necrosis: no Has patient had a PCN reaction that required hospitalization no Has patient had a PCN reaction occurring within the last 10 years: no If all of the above answers are "NO", then may proceed with Cephalosporin use.  Has patient had a PCN reaction causing immediate rash, facial/tongue/throat swelling, SOB or lightheadedness with hypotension: no Has patient had a PCN reaction causing severe rash involving mucus membranes or skin necrosis: no Has patient had a PCN reaction that required hospitalization no Has patient had a PCN reaction occurring within the last 10 years: no If all of the above answers are "NO", then may proceed with Cephalosporin use.   Objective:  There were no vitals filed for this visit. There is no height or weight on file to calculate BMI. Constitutional Well developed. Well nourished.  Vascular Dorsalis pedis pulses palpable bilaterally. Posterior tibial pulses palpable bilaterally. Capillary refill normal to all digits.  No cyanosis or clubbing noted. Pedal hair growth normal.  Neurologic Normal speech. Oriented to person, place, and time. Epicritic sensation to light touch grossly present bilaterally.  Dermatologic Pain on palpation of the entire/total nail on 1st digit of the bilaterally No other  open wounds. No skin lesions.  Orthopedic: Normal joint ROM without pain or crepitus bilaterally. No visible deformities. No bony tenderness.   Radiographs: None Assessment:   1. Ingrown right big toenail   2. Ingrown left big toenail    Plan:  Patient was evaluated and treated and all questions answered.  Nail contusion/dystrophy hallux, bilaterally -Patient elects to proceed with minor surgery to remove entire toenail today. Consent reviewed and signed by patient. -Entire/total nail excised. See procedure note. -Educated on  post-procedure care including soaking. Written instructions provided and reviewed. -Patient to follow up in 2 weeks for nail check.  Procedure: Excision of entire/total nail  Location: Bilateral 1st toe digit Anesthesia: Lidocaine 1% plain; 1.5 mL and Marcaine 0.5% plain; 1.5 mL, digital block. Skin Prep: Betadine. Dressing: Silvadene; telfa; dry, sterile, compression dressing. Technique: Following skin prep, the toe was exsanguinated and a tourniquet was secured at the base of the toe. The affected nail border was freed and excised. The tourniquet was then removed and sterile dressing applied. Disposition: Patient tolerated procedure well. Patient to return in 2 weeks for follow-up.   No follow-ups on file.

## 2022-10-02 ENCOUNTER — Emergency Department
Admission: EM | Admit: 2022-10-02 | Discharge: 2022-10-02 | Disposition: A | Discharge status: Home / Self Care | Payer: Medicare Other | Attending: Emergency Medicine | Admitting: Emergency Medicine

## 2022-10-02 ENCOUNTER — Encounter: Payer: Self-pay | Admitting: Emergency Medicine

## 2022-10-02 ENCOUNTER — Other Ambulatory Visit: Payer: Self-pay

## 2022-10-02 DIAGNOSIS — I10 Essential (primary) hypertension: Secondary | ICD-10-CM | POA: Diagnosis not present

## 2022-10-02 DIAGNOSIS — Z4800 Encounter for change or removal of nonsurgical wound dressing: Secondary | ICD-10-CM | POA: Insufficient documentation

## 2022-10-02 DIAGNOSIS — Z5189 Encounter for other specified aftercare: Secondary | ICD-10-CM
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
## 2022-10-02 NOTE — Discharge Instructions (Addendum)
You can apply neosporin to the scab to decrease scarring. You can keep it covered at night to protect from unconscious itching otherwise you can leave it open to the air.   Watch for signs of infection including redness, warmth, swelling, pain and pus draining. If you develop these return to the ED, urgent care or your primary doctor.

## 2022-10-02 NOTE — ED Triage Notes (Signed)
Patient to ED via POV for wound check to left arm. Patient states he cut a tree and it caught his arm- 2 weeks ago. Wife put steri strips on arm. Scab noted on arm. Denies fever or any other symptoms at this time.

## 2022-10-02 NOTE — ED Provider Notes (Signed)
[facility] Provider Note    Event Date/Time   First MD Initiated Contact with Patient 10/02/22 [phone]     (approximate)   History   Wound Check   HPI  Robert Alexander is a 87 y.o. male  with PMH of HTN, anxiety and arthritis presents for a wound check. Patient cut his arm while working in his yard 2 weeks ago. He describes it as a skin tear. His wife repaired it with steri strips. He wanted to make sure it wasn't getting infected.      Physical Exam   Triage Vital Signs: ED Triage Vitals  Encounter Vitals Group     BP 10/02/22 0748 (!) 163/76     Systolic BP Percentile --      Diastolic BP Percentile --      Pulse Rate 10/02/22 0748 64     Resp --      Temp 10/02/22 0748 97.7 F (36.5 C)     Temp Source 10/02/22 0748 Oral     SpO2 10/02/22 0748 97 %     Weight 10/02/22 0745 148 lb (67.1 kg)     Height 10/02/22 0745 5\' 7"  (1.702 m)     Head Circumference --      Peak Flow --      Pain Score 10/02/22 0745 7     Pain Loc --      Pain Education --      Exclude from Growth Chart --     Most recent vital signs: Vitals:   10/02/22 0748  BP: (!) 163/76  Pulse: 64  Temp: 97.7 F (36.5 C)  SpO2: 97%    General: Awake, no distress.  CV:  Good peripheral perfusion.  Resp:  Normal effort.  Abd:  No distention.  Other:  Scab to left forearm with some surrounding granulation tissue. No erythema, warmth, swelling or TTP. No induration or fluctuance.    ED Results / Procedures / Treatments   Labs (all labs ordered are listed, but only abnormal results are displayed) Labs Reviewed - No data to display   PROCEDURES:  Critical Care performed: No  Procedures   MEDICATIONS ORDERED IN ED: Medications - No data to display   IMPRESSION / MDM / ASSESSMENT AND PLAN / ED COURSE  I reviewed the triage vital signs and the nursing notes.                             87 year old male presents for a wound check. Patient was hypertensive in  triage but does have a history of HTN. NAD on exam.  Differential diagnosis includes, but is not limited to, wound check, cellulitis, abrasion, abscess.  Patient's presentation is most consistent with acute, uncomplicated illness.  Based on my physical exam I do not believe patient has a cellulitis or an abscess so oral antibiotics are not indicated. Patient's wound is healing well with a scab. I advised him on wound care and explained he can apply neosporin to the area if he likes to decrease scarring. Patient was educated on signs of wound infection to look for and was instructed to return to the ED if he develops these. Patient voiced understanding, all questions were answered and he was stable at discharge.      FINAL CLINICAL IMPRESSION(S) / ED DIAGNOSES   Final diagnoses:  Visit for wound check     Rx / DC Orders  ED Discharge Orders     None        Note:  This document was prepared using Dragon voice recognition software and may include unintentional dictation errors.   Cameron Ali, PA-C 10/02/22 1308    Trinna Post, MD 10/03/22 0700

## 2022-10-06 NOTE — Group Note (Deleted)

## 2022-10-24 ENCOUNTER — Other Ambulatory Visit: Payer: Self-pay

## 2022-10-24 ENCOUNTER — Emergency Department
Admission: EM | Admit: 2022-10-24 | Discharge: 2022-10-24 | Disposition: A | Discharge status: Home / Self Care | Payer: Medicare Other | Attending: Emergency Medicine | Admitting: Emergency Medicine

## 2022-10-24 DIAGNOSIS — S51812A Laceration without foreign body of left forearm, initial encounter: Secondary | ICD-10-CM | POA: Insufficient documentation

## 2022-10-24 DIAGNOSIS — W228XXA Striking against or struck by other objects, initial encounter: Secondary | ICD-10-CM | POA: Insufficient documentation

## 2022-10-24 DIAGNOSIS — S59912A Unspecified injury of left forearm, initial encounter: Secondary | ICD-10-CM | POA: Diagnosis present

## 2022-10-24 DIAGNOSIS — I10 Essential (primary) hypertension: Secondary | ICD-10-CM | POA: Diagnosis not present

## 2022-10-24 MED ORDER — LIDOCAINE-EPINEPHRINE-TETRACAINE (LET) TOPICAL GEL
3.0000 mL | Freq: Once | TOPICAL | Status: AC
  Administered 2022-10-24: 3 mL via TOPICAL
  Filled 2022-10-24: qty 3

## 2022-10-24 NOTE — ED Triage Notes (Signed)
Pt ambulatory to ER for left arm laceration from opening the car door. States he had tetanus shot in the last couple years. Denies being on blood thinners. Bleeding controlled.

## 2022-10-24 NOTE — ED Provider Notes (Signed)
[facility] Provider Note    Event Date/Time   First MD Initiated Contact with Patient 10/24/22 1409     (approximate)   History   Laceration   HPI  Robert Alexander is a 87 y.o. male with history of hypertension, kidney stones, GERD and hyperlipidemia presents emergency department with a skin tear of the left forearm.  Patient states he was getting his dog out of a car and hit the door causing a skin tear.  Tetanus is up-to-date.  No other injuries reported.      Physical Exam   Triage Vital Signs: ED Triage Vitals  Encounter Vitals Group     BP 10/24/22 1333 (!) 144/79     Systolic BP Percentile --      Diastolic BP Percentile --      Pulse Rate 10/24/22 1333 76     Resp 10/24/22 1333 18     Temp 10/24/22 1333 97.7 F (36.5 C)     Temp Source 10/24/22 1333 Oral     SpO2 10/24/22 1333 96 %     Weight --      Height --      Head Circumference --      Peak Flow --      Pain Score 10/24/22 1332 0     Pain Loc --      Pain Education --      Exclude from Growth Chart --     Most recent vital signs: Vitals:   10/24/22 1333  BP: (!) 144/79  Pulse: 76  Resp: 18  Temp: 97.7 F (36.5 C)  SpO2: 96%     General: Awake, no distress.   CV:  Good peripheral perfusion. regular rate and  rhythm Resp:  Normal effort.  Abd:  No distention.   Other:  Half-moon shaped skin tear noted on the left forearm, no active bleeding,   ED Results / Procedures / Treatments   Labs (all labs ordered are listed, but only abnormal results are displayed) Labs Reviewed - No data to display   EKG     RADIOLOGY     PROCEDURES:   .Marland KitchenLaceration Repair  Date/Time: 10/24/2022 3:17 PM  Performed by: Faythe Ghee, PA-C Authorized by: Faythe Ghee, PA-C   Consent:    Consent obtained:  Verbal   Consent given by:  Patient   Risks, benefits, and alternatives were discussed: yes     Risks discussed:  Infection, pain, retained foreign body,  tendon damage, poor cosmetic result, need for additional repair, nerve damage, poor wound healing and vascular damage   Alternatives discussed:  No treatment Universal protocol:    Procedure explained and questions answered to patient or proxy's satisfaction: yes     Immediately prior to procedure, a time out was called: yes     Patient identity confirmed:  Verbally with patient Anesthesia:    Anesthesia method:  Topical application   Topical anesthetic:  LET Laceration details:    Location:  Shoulder/arm   Shoulder/arm location:  L lower arm   Length (cm):  6 Exploration:    Hemostasis achieved with:  LET   Imaging outcome: foreign body not noted     Wound exploration: entire depth of wound visualized     Wound extent: areolar tissue not violated, fascia not violated, no foreign body, no signs of injury, no nerve damage, no tendon damage, no underlying fracture and no vascular damage     Contaminated: no  Treatment:    Area cleansed with:  Saline   Amount of cleaning:  Standard   Irrigation solution:  Sterile saline   Irrigation method:  Tap Skin repair:    Repair method:  Steri-Strips and tissue adhesive   Number of Steri-Strips:  4 Approximation:    Approximation:  Close Repair type:    Repair type:  Simple Post-procedure details:    Dressing:  Open (no dressing)    MEDICATIONS ORDERED IN ED: Medications  lidocaine-EPINEPHrine-tetracaine (LET) topical gel (3 mLs Topical Given 10/24/22 1430)     IMPRESSION / MDM / ASSESSMENT AND PLAN / ED COURSE  I reviewed the triage vital signs and the nursing notes.                              Differential diagnosis includes, but is not limited to, laceration, abrasion, skin tear  Patient's presentation is most consistent with acute, uncomplicated illness.   Patient's exam is most consistent with skin tear.  Procedure note for laceration repair   Patient tolerated procedure well.  He was given instructions on Steri-Strips.   Follow-up with his regular doctor if not improved in 3 days.  Return for worsening.  Patient is in agreement treatment plan.  Discharged stable condition.   FINAL CLINICAL IMPRESSION(S) / ED DIAGNOSES   Final diagnoses:  Laceration of left forearm, initial encounter     Rx / DC Orders   ED Discharge Orders     None        Note:  This document was prepared using Dragon voice recognition software and may include unintentional dictation errors.    Faythe Ghee, PA-C 10/24/22 1519    Sharman Cheek, MD 10/24/22 [phone]

## 2023-03-16 DIAGNOSIS — M7582 Other shoulder lesions, left shoulder: Secondary | ICD-10-CM | POA: Insufficient documentation

## 2023-03-22 ENCOUNTER — Encounter: Payer: Self-pay | Admitting: Intensive Care

## 2023-03-22 ENCOUNTER — Emergency Department
Admission: EM | Admit: 2023-03-22 | Discharge: 2023-03-22 | Disposition: A | Discharge status: Home / Self Care | Payer: Medicare Other | Attending: Student in an Organized Health Care Education/Training Program | Admitting: Student in an Organized Health Care Education/Training Program

## 2023-03-22 ENCOUNTER — Other Ambulatory Visit: Payer: Self-pay

## 2023-03-22 DIAGNOSIS — W208XXA Other cause of strike by thrown, projected or falling object, initial encounter: Secondary | ICD-10-CM | POA: Diagnosis not present

## 2023-03-22 DIAGNOSIS — Y92007 Garden or yard of unspecified non-institutional (private) residence as the place of occurrence of the external cause: Secondary | ICD-10-CM | POA: Insufficient documentation

## 2023-03-22 DIAGNOSIS — I10 Essential (primary) hypertension: Secondary | ICD-10-CM | POA: Diagnosis not present

## 2023-03-22 DIAGNOSIS — S81811A Laceration without foreign body, right lower leg, initial encounter: Secondary | ICD-10-CM | POA: Insufficient documentation

## 2023-03-22 MED ORDER — BACITRACIN ZINC 500 UNIT/GM EX OINT
TOPICAL_OINTMENT | Freq: Two times a day (BID) | CUTANEOUS | Status: DC
  Filled 2023-03-22: qty 0.9

## 2023-03-22 MED ORDER — BACITRACIN 500 UNIT/GM EX OINT: 1.0000 | TOPICAL_OINTMENT | Freq: Two times a day (BID) | CUTANEOUS | 0 refills | Status: AC

## 2023-03-22 NOTE — Discharge Instructions (Signed)
 The Steri-Strips will begin to fall off on their own in about a week.  You can get these wet.  Pat dry.  After the Steri-Strips fall off, you can apply bacitracin ointment around the area to promote healing and prevent infection.

## 2023-03-22 NOTE — ED Triage Notes (Signed)
 Patient reports he was trimming limbs in a tree and one fell on his right leg and pulled top layer of skin off.

## 2023-03-22 NOTE — ED Notes (Signed)
 See triage note. Presents with injury to right lower leg  States he was hit with a tree limb  Skin tear noted    bleeding controlled

## 2023-03-22 NOTE — ED Provider Notes (Signed)
 [facility] Provider Note    Event Date/Time   First MD Initiated Contact with Patient 03/22/23 1135     (approximate)   History   Leg Injury   HPI  Robert Alexander is a 88 y.o. male with PMH of hypertension, BPH, degenerative disc disease and anxiety presents for evaluation of a wound to the right shin.  Patient was cutting limbs off of a tree in his yard earlier today when 1 fell hitting him in the shin.  His wife washed it off, put some Neosporin on it and close it with some Steri-Strips.      Physical Exam   Triage Vital Signs: ED Triage Vitals  Encounter Vitals Group     BP 03/22/23 1129 (!) 159/78     Systolic BP Percentile --      Diastolic BP Percentile --      Pulse Rate 03/22/23 1129 72     Resp 03/22/23 1129 16     Temp 03/22/23 1129 98 F (36.7 C)     Temp Source 03/22/23 1129 Oral     SpO2 03/22/23 1129 98 %     Weight 03/22/23 1130 140 lb (63.5 kg)     Height 03/22/23 1130 5\' 7"  (1.702 m)     Head Circumference --      Peak Flow --      Pain Score 03/22/23 1130 4     Pain Loc --      Pain Education --      Exclude from Growth Chart --     Most recent vital signs: Vitals:   03/22/23 1129  BP: (!) 159/78  Pulse: 72  Resp: 16  Temp: 98 F (36.7 C)  SpO2: 98%   General: Awake, no distress.  CV:  Good peripheral perfusion.  Resp:  Normal effort.  Abd:  No distention.  Other:  Approximately 3 cm long and 2 cm wide skin tear to the right anterior shin.  No bleeding at this time.   ED Results / Procedures / Treatments   Labs (all labs ordered are listed, but only abnormal results are displayed) Labs Reviewed - No data to display  PROCEDURES:  Critical Care performed: No  Procedures   MEDICATIONS ORDERED IN ED: Medications  bacitracin ointment ( Topical Given by Other 03/22/23 1200)     IMPRESSION / MDM / ASSESSMENT AND PLAN / ED COURSE  I reviewed the triage vital signs and the nursing notes.                              88 year old male presents for evaluation of a leg injury.  Patient was hypertensive in triage otherwise vital signs are stable.  Patient NAD on exam.  Differential diagnosis includes, but is not limited to, laceration, abrasion, wound infection, fracture.  Patient's presentation is most consistent with acute, uncomplicated illness.  Patient has a skin tear to the anterior right shin.  This was rinsed with saline, bacitracin ointment applied and then covered with Steri-Strips.  Patient advised on wound care.  I will send some bacitracin to the pharmacy so he can use this at home.  He voiced understanding, all questions were answered and he was stable at discharge.     FINAL CLINICAL IMPRESSION(S) / ED DIAGNOSES   Final diagnoses:  Noninfected skin tear of leg, right, initial encounter     Rx / DC Orders   ED  Discharge Orders          Ordered    bacitracin 500 UNIT/GM ointment  2 times daily        03/22/23 1217             Note:  This document was prepared using Dragon voice recognition software and may include unintentional dictation errors.   Cameron Ali, PA-C 03/22/23 1219    Merwyn Katos, MD 03/22/23 1350

## 2023-03-31 ENCOUNTER — Ambulatory Visit (INDEPENDENT_AMBULATORY_CARE_PROVIDER_SITE_OTHER): Admitting: Podiatry

## 2023-03-31 DIAGNOSIS — L600 Ingrowing nail: Secondary | ICD-10-CM

## 2023-03-31 NOTE — Progress Notes (Signed)
 Subjective:  Patient ID: Robert Alexander, male    DOB: [birth year],  MRN: [id number]  Chief Complaint  Patient presents with   Nail Problem    88 y.o. male presents with the above complaint.  Patient presents with left second digit ingrown nail hurts with ambulation worse with pressure patient would like to have the entire nail removed.  He is very nail dystrophy.  Pain scale 7 out of 10 dull aching nature denies any other acute complaints   Review of Systems: Negative except as noted in the HPI. Denies N/V/F/Ch.  Past Medical History:  Diagnosis Date   Acute recurrent sinusitis    Anxiety    BPH (benign prostatic hyperplasia)    DDD (degenerative disc disease), lumbar    DDD (degenerative disc disease), lumbar    Environmental allergies    Esophageal reflux    GERD (gastroesophageal reflux disease)    History of colonic polyps    History of kidney stones    Hyperlipidemia    Hypertension    Schatzki's ring     Current Outpatient Medications:    azithromycin (ZITHROMAX) 250 MG tablet, Take by mouth., Disp: , Rfl:    acetaminophen (TYLENOL) 325 MG tablet, Take 325 mg by mouth daily., Disp: , Rfl:    amLODipine (NORVASC) 5 MG tablet, Take 5 mg by mouth daily., Disp: , Rfl:    bacitracin 500 UNIT/GM ointment, Apply 1 Application topically 2 (two) times daily., Disp: 15 g, Rfl: 0   benzonatate (TESSALON) 200 MG capsule, TAKE 1 CAPSULE BY MOUTH THREE TIMES A DAY AS NEEDED FOR COUGH FOR UP TO 7 DAYS, Disp: , Rfl:    celecoxib (CELEBREX) 200 MG capsule, Take 200 mg by mouth 2 (two) times daily.  (Patient not taking: Reported on 12/30/2021), Disp: , Rfl:    cetirizine (ZYRTEC) 10 MG tablet, TAKE 1 PILL ONCE DAILY AS NEEDED, Disp: , Rfl:    clarithromycin (BIAXIN) 500 MG tablet, TAKE 1 TABLET BY MOUTH TWICE A DAY FOR 14 DAYS, Disp: , Rfl:    Cyanocobalamin (VITAMIN B-12 PO), Take by mouth daily., Disp: , Rfl:    LORazepam (ATIVAN) 1 MG tablet, Take 1 mg by mouth at bedtime as needed for  anxiety or sleep. , Disp: , Rfl:    omeprazole (PRILOSEC) 40 MG capsule, Take 40 mg by mouth daily., Disp: , Rfl: 3   traMADol (ULTRAM) 50 MG tablet, Take 1 tablet (50 mg total) by mouth every 6 (six) hours as needed., Disp: 12 tablet, Rfl: 0  Social History   Tobacco Use  Smoking Status Never  Smokeless Tobacco Current   Types: Chew    Allergies  Allergen Reactions   Penicillins Rash    Has patient had a PCN reaction causing immediate rash, facial/tongue/throat swelling, SOB or lightheadedness with hypotension: no Has patient had a PCN reaction causing severe rash involving mucus membranes or skin necrosis: no Has patient had a PCN reaction that required hospitalization no Has patient had a PCN reaction occurring within the last 10 years: no If all of the above answers are "NO", then may proceed with Cephalosporin use.  Has patient had a PCN reaction causing immediate rash, facial/tongue/throat swelling, SOB or lightheadedness with hypotension: no Has patient had a PCN reaction causing severe rash involving mucus membranes or skin necrosis: no Has patient had a PCN reaction that required hospitalization no Has patient had a PCN reaction occurring within the last 10 years: no If all of the above answers  are "NO", then may proceed with Cephalosporin use.   Objective:  There were no vitals filed for this visit. There is no height or weight on file to calculate BMI. Constitutional Well developed. Well nourished.  Vascular Dorsalis pedis pulses palpable bilaterally. Posterior tibial pulses palpable bilaterally. Capillary refill normal to all digits.  No cyanosis or clubbing noted. Pedal hair growth normal.  Neurologic Normal speech. Oriented to person, place, and time. Epicritic sensation to light touch grossly present bilaterally.  Dermatologic Pain on palpation of the entire/total nail on 2nd digit of the left No other open wounds. No skin lesions.  Orthopedic: Normal joint  ROM without pain or crepitus bilaterally. No visible deformities. No bony tenderness.   Radiographs: None Assessment:   1. Ingrown nail of second toe of left foot    Plan:  Patient was evaluated and treated and all questions answered.  Nail contusion/dystrophy second, left -Patient elects to proceed with minor surgery to remove entire toenail today. Consent reviewed and signed by patient. -Entire/total nail excised. See procedure note. -Educated on post-procedure care including soaking. Written instructions provided and reviewed. -Patient to follow up in 2 weeks for nail check.  Procedure: Excision of entire/total nail  Location: Left 2nd toe digit Anesthesia: Lidocaine 1% plain; 1.5 mL and Marcaine 0.5% plain; 1.5 mL, digital block. Skin Prep: Betadine. Dressing: Silvadene; telfa; dry, sterile, compression dressing. Technique: Following skin prep, the toe was exsanguinated and a tourniquet was secured at the base of the toe. The affected nail border was freed and excised. The tourniquet was then removed and sterile dressing applied. Disposition: Patient tolerated procedure well. Patient to return in 2 weeks for follow-up.   No follow-ups on file.

## 2023-04-03 ENCOUNTER — Ambulatory Visit: Admitting: Podiatry

## 2023-05-14 ENCOUNTER — Other Ambulatory Visit: Payer: Self-pay | Admitting: Internal Medicine

## 2023-05-14 DIAGNOSIS — R531 Weakness: Secondary | ICD-10-CM

## 2023-05-14 DIAGNOSIS — R42 Dizziness and giddiness: Secondary | ICD-10-CM

## 2023-05-20 ENCOUNTER — Ambulatory Visit
Admission: RE | Admit: 2023-05-20 | Discharge: 2023-05-20 | Disposition: A | Discharge status: Home / Self Care | Source: Ambulatory Visit | Attending: Internal Medicine | Admitting: Internal Medicine

## 2023-05-20 DIAGNOSIS — R42 Dizziness and giddiness: Secondary | ICD-10-CM | POA: Insufficient documentation

## 2023-05-20 DIAGNOSIS — R531 Weakness: Secondary | ICD-10-CM | POA: Diagnosis present

## 2023-05-27 ENCOUNTER — Ambulatory Visit: Admission: RE | Admit: 2023-05-27 | Source: Ambulatory Visit

## 2023-07-08 ENCOUNTER — Emergency Department
Admission: EM | Admit: 2023-07-08 | Discharge: 2023-07-08 | Disposition: A | Discharge status: Home / Self Care | Attending: Emergency Medicine | Admitting: Emergency Medicine

## 2023-07-08 ENCOUNTER — Other Ambulatory Visit: Payer: Self-pay

## 2023-07-08 ENCOUNTER — Emergency Department

## 2023-07-08 DIAGNOSIS — M795 Residual foreign body in soft tissue: Secondary | ICD-10-CM

## 2023-07-08 DIAGNOSIS — W458XXA Other foreign body or object entering through skin, initial encounter: Secondary | ICD-10-CM | POA: Insufficient documentation

## 2023-07-08 DIAGNOSIS — S6992XA Unspecified injury of left wrist, hand and finger(s), initial encounter: Secondary | ICD-10-CM | POA: Diagnosis present

## 2023-07-08 DIAGNOSIS — S60451A Superficial foreign body of left index finger, initial encounter: Secondary | ICD-10-CM | POA: Insufficient documentation

## 2023-07-08 DIAGNOSIS — H578A1 Foreign body sensation, right eye: Secondary | ICD-10-CM | POA: Insufficient documentation

## 2023-07-08 DIAGNOSIS — I10 Essential (primary) hypertension: Secondary | ICD-10-CM | POA: Insufficient documentation

## 2023-07-08 MED ORDER — LIDOCAINE-EPINEPHRINE (PF) 2 %-1:200000 IJ SOLN
10.0000 mL | Freq: Once | INTRAMUSCULAR | Status: AC
  Administered 2023-07-08: 10 mL via INTRADERMAL
  Filled 2023-07-08: qty 20

## 2023-07-08 MED ORDER — CEPHALEXIN 500 MG PO CAPS: 500.0000 mg | ORAL_CAPSULE | Freq: Three times a day (TID) | ORAL | 0 refills | Status: AC

## 2023-07-08 MED ORDER — FLUORESCEIN SODIUM 1 MG OP STRP
1.0000 | ORAL_STRIP | Freq: Once | OPHTHALMIC | Status: AC
  Administered 2023-07-08: 1 via OPHTHALMIC
  Filled 2023-07-08: qty 1

## 2023-07-08 NOTE — ED Provider Notes (Signed)
 [facility] Provider Note    Event Date/Time   First MD Initiated Contact with Patient 07/08/23 1011     (approximate)   History   Finger Injury   HPI  Robert Alexander is a 88 y.o. male  with history of HTN, HLD and as listed in EMR presents to the emergency department for treatment and evaluation of foreign body in left index finger for the past week or so. Potentially hay/thorn. He has attempted to remove it with a needle, but not successful. He has also felt that he may have something in his right eye after mowing with his tractor a few days ago. No vision changes. He tried eye drops without relief.      Physical Exam   Triage Vital Signs: ED Triage Vitals  Encounter Vitals Group     BP 07/08/23 0954 (!) 153/70     Systolic BP Percentile --      Diastolic BP Percentile --      Pulse Rate 07/08/23 0953 83     Resp 07/08/23 0953 20     Temp 07/08/23 0953 98.1 F (36.7 C)     Temp Source 07/08/23 0953 Oral     SpO2 07/08/23 0953 97 %     Weight --      Height --      Head Circumference --      Peak Flow --      Pain Score 07/08/23 0954 9     Pain Loc --      Pain Education --      Exclude from Growth Chart --     Most recent vital signs: Vitals:   07/08/23 0953 07/08/23 0954  BP:  (!) 153/70  Pulse: 83   Resp: 20   Temp: 98.1 F (36.7 C)   SpO2: 97%     General: Awake, no distress.  CV:  Good peripheral perfusion.  Resp:  Normal effort.  Abd:  No distention.  Other:  Left index finger with scabbed, elevated lesion on dorsal surface.    Left eye clear without drainage or erythema. No pain on EOM.   ED Results / Procedures / Treatments   Labs (all labs ordered are listed, but only abnormal results are displayed) Labs Reviewed - No data to display   EKG  Not indicated.   RADIOLOGY  Image and radiology report reviewed and interpreted by me. Radiology report consistent with the same.  No retained foreign body noted in  the image of the left index finger.  PROCEDURES:  Critical Care performed: No  .Foreign Body Removal  Date/Time: 07/08/2023 12:11 PM  Performed by: Sherryle Don, FNP Authorized by: Sherryle Don, FNP  Consent: Verbal consent obtained. Consent given by: patient Imaging studies: imaging studies available Patient identity confirmed: verbally with patient Body area: skin General location: upper extremity Location details: left index finger Anesthesia: local infiltration  Anesthesia: Local Anesthetic: lidocaine  2% with epinephrine  Removal mechanism: scalpel (tweezers) Dressing: dressing applied Depth: subcutaneous Complexity: simple 1 objects recovered. Objects recovered: thorn Post-procedure assessment: foreign body removed Patient tolerance: patient tolerated the procedure well with no immediate complications Comments: Scant amount of purulent drainage expressed post removal of small thorn    MEDICATIONS ORDERED IN ED:  Medications  fluorescein ophthalmic strip 1 strip (1 strip Right Eye Given by Other 07/08/23 1045)  lidocaine -EPINEPHrine  (XYLOCAINE  W/EPI) 2 %-1:200000 (PF) injection 10 mL (10 mLs Intradermal Given 07/08/23 1142)     IMPRESSION /  MDM / ASSESSMENT AND PLAN / ED COURSE   I have reviewed the triage note.  Differential diagnosis includes, but is not limited to, abscess, cellulitis, corneal abrasion, retained foreign body of eye/finger  Patient's presentation is most consistent with acute illness / injury with system symptoms.  88 year old male presenting to the emergency department for evaluation of potential foreign body to the left index finger that has been present for several days.  He suspects it might be from a thorn or hay.  He also feels that he could have a scratch on his cornea or something in his eye after mowing his field with his tractor.  See HPI for further details.  Vital signs are reassuring.  Imaging of the left index finger is  negative for radiopaque retained foreign body.  Fluorescein stain exam of the right eye is negative for corneal abrasion or retained foreign body.  Left index finger anesthetized with lidocaine  and opened with scalpel to evaluate for foreign body not radiopaque.  Small thorn was expressed after scabbed area removed.  There was also some purulent drainage.  Plan will be to have the patient use artificial tears in his eye for irritation and he will be treated with Keflex for treatment of infection in the index finger.  He will also continue to soak in Epsom salt and warm water about 3 times a day.  If he continues to have pain in the finger, the finger gets more red or swollen he is to see primary care.  If his eye continues to feel irritated or if he has any new eye symptoms he is to see his ophthalmologist.  For any of the above that changes or worsens and he is unable to schedule an appointment outpatient he is to return to the emergency department.      FINAL CLINICAL IMPRESSION(S) / ED DIAGNOSES   Final diagnoses:  Foreign body sensation, right eye  Retained foreign body of finger     Rx / DC Orders   ED Discharge Orders          Ordered    cephALEXin (KEFLEX) 500 MG capsule  3 times daily        07/08/23 1119             Note:  This document was prepared using Dragon voice recognition software and may include unintentional dictation errors.   Sherryle Don, FNP 07/08/23 1221    Bryson Carbine, MD 07/08/23 1256

## 2023-07-08 NOTE — ED Triage Notes (Signed)
 Pt to ED via POV from home. Pt reports got something stuck on top of left index finger x1 wk ago. Unsure of what it was. Pt reports increased pain. Pt has scabbing to top of finger.

## 2023-07-08 NOTE — Discharge Instructions (Signed)
 For your eye, use artificial tears or eye wash that you can buy at the pharmacy.  Take the antibiotic for your finger. You may soak in Epson salt 3 times per day as well.  Follow up with Dr. Claudius Cumins or return to the ER for symptoms of concern.

## 2023-10-18 ENCOUNTER — Other Ambulatory Visit: Payer: Self-pay

## 2023-10-18 ENCOUNTER — Emergency Department
Admission: EM | Admit: 2023-10-18 | Discharge: 2023-10-18 | Discharge status: Against Medical Advice | Attending: Emergency Medicine | Admitting: Emergency Medicine

## 2023-10-18 DIAGNOSIS — S8991XA Unspecified injury of right lower leg, initial encounter: Secondary | ICD-10-CM | POA: Diagnosis present

## 2023-10-18 DIAGNOSIS — I10 Essential (primary) hypertension: Secondary | ICD-10-CM | POA: Insufficient documentation

## 2023-10-18 DIAGNOSIS — S81811A Laceration without foreign body, right lower leg, initial encounter: Secondary | ICD-10-CM | POA: Diagnosis not present

## 2023-10-18 DIAGNOSIS — Z23 Encounter for immunization: Secondary | ICD-10-CM | POA: Diagnosis not present

## 2023-10-18 DIAGNOSIS — W268XXA Contact with other sharp object(s), not elsewhere classified, initial encounter: Secondary | ICD-10-CM | POA: Diagnosis not present

## 2023-10-18 DIAGNOSIS — Y92513 Shop (commercial) as the place of occurrence of the external cause: Secondary | ICD-10-CM | POA: Insufficient documentation

## 2023-10-18 MED ORDER — TETANUS-DIPHTH-ACELL PERTUSSIS 5-2.5-18.5 LF-MCG/0.5 IM SUSY
0.5000 mL | PREFILLED_SYRINGE | Freq: Once | INTRAMUSCULAR | Status: AC
Start: 2023-10-18 — End: 2023-10-18
  Administered 2023-10-18: 0.5 mL via INTRAMUSCULAR
  Filled 2023-10-18: qty 0.5

## 2023-10-18 MED ORDER — LIDOCAINE HCL (PF) 1 % IJ SOLN
5.0000 mL | Freq: Once | INTRAMUSCULAR | Status: DC
  Filled 2023-10-18: qty 5

## 2023-10-18 MED ORDER — LIDOCAINE-EPINEPHRINE-TETRACAINE (LET) TOPICAL GEL
3.0000 mL | Freq: Once | TOPICAL | Status: AC
  Administered 2023-10-18: 3 mL via TOPICAL
  Filled 2023-10-18: qty 3

## 2023-10-18 NOTE — ED Provider Notes (Signed)
 [facility] Emergency Department Provider Note     Event Date/Time   First MD Initiated Contact with Patient 10/18/23 1803     (approximate)   History   Laceration   HPI  Robert Alexander is a 88 y.o. male with a past medical history of HTN, BPH, GERD, HLD, anxiety presents to the ED for evaluation of laceration to the posterior aspect of left lower leg.  Patient reports he believes he fell backwards onto a piece of equipment in his shop and noted bleeding to the area through his pants leg. Unknown last tetanus.  Denies blood thinners.    Physical Exam   Triage Vital Signs: ED Triage Vitals  Encounter Vitals Group     BP 10/18/23 1750 (!) 146/84     Girls Systolic BP Percentile --      Girls Diastolic BP Percentile --      Boys Systolic BP Percentile --      Boys Diastolic BP Percentile --      Pulse Rate 10/18/23 1750 91     Resp 10/18/23 1750 18     Temp 10/18/23 1750 98 F (36.7 C)     Temp src --      SpO2 10/18/23 1750 96 %     Weight --      Height --      Head Circumference --      Peak Flow --      Pain Score 10/18/23 1749 4     Pain Loc --      Pain Education --      Exclude from Growth Chart --     Most recent vital signs: Vitals:   10/18/23 1750  BP: (!) 146/84  Pulse: 91  Resp: 18  Temp: 98 F (36.7 C)  SpO2: 96%    General Awake, no distress. Well appearing.  HEENT NCAT.  CV:  Good peripheral perfusion.  RESP:  Normal effort.  ABD:  No distention.  Other:  Left leg reveals a proximal 6 to 7 cm skin tear to medial posterior aspect of lower extremity. Bleeding controlled.    ED Results / Procedures / Treatments   Labs (all labs ordered are listed, but only abnormal results are displayed) Labs Reviewed - No data to display  No results found.  PROCEDURES:  Critical Care performed: No  Procedures   MEDICATIONS ORDERED IN ED: Medications  lidocaine  (PF) (XYLOCAINE ) 1 % injection 5 mL (has no  administration in time range)  Tdap (BOOSTRIX) injection 0.5 mL (0.5 mLs Intramuscular Given 10/18/23 1823)  lidocaine -EPINEPHrine -tetracaine  (LET) topical gel (3 mLs Topical Given 10/18/23 1825)     IMPRESSION / MDM / ASSESSMENT AND PLAN / ED COURSE  I reviewed the triage vital signs and the nursing notes.                               88 y.o. male presents to the emergency department for evaluation and treatment of left lower extremity laceration. See HPI for further details.   Differential diagnosis includes, but is not limited to skin tear, laceration, abrasion  Patient's presentation is most consistent with acute, uncomplicated illness.  Patient is alert and oriented.  He is hemodynamic stable.  Physical exam findings are stated above and presentation is clinically consistent with a skin tear.  Depth is deep enough for laceration repair which was discussed with patient.  He verbalized  understanding.  Tetanus and LET was applied to area by nurse.  When returning to room with laceration kit patient was no longer in room.  Patient eloped from emergency room.   FINAL CLINICAL IMPRESSION(S) / ED DIAGNOSES   Final diagnoses:  Laceration of right lower extremity, initial encounter   Rx / DC Orders   ED Discharge Orders     None        Note:  This document was prepared using Dragon voice recognition software and may include unintentional dictation errors.    Margrette, Enoc Getter A, PA-C 10/18/23 1952    Bradler, Evan K, MD 10/24/23 [phone]

## 2023-10-18 NOTE — ED Triage Notes (Signed)
 Pt comes with left laceration to back of leg. Pt states he backed into some equipment in his shop and cut it. Pt has bandage in place.

## 2023-10-19 ENCOUNTER — Emergency Department
Admission: EM | Admit: 2023-10-19 | Discharge: 2023-10-19 | Disposition: A | Discharge status: Home / Self Care | Attending: Emergency Medicine | Admitting: Emergency Medicine

## 2023-10-19 ENCOUNTER — Other Ambulatory Visit: Payer: Self-pay

## 2023-10-19 DIAGNOSIS — S81811D Laceration without foreign body, right lower leg, subsequent encounter: Secondary | ICD-10-CM

## 2023-10-19 DIAGNOSIS — S81802A Unspecified open wound, left lower leg, initial encounter: Secondary | ICD-10-CM | POA: Insufficient documentation

## 2023-10-19 DIAGNOSIS — S8992XA Unspecified injury of left lower leg, initial encounter: Secondary | ICD-10-CM | POA: Diagnosis present

## 2023-10-19 DIAGNOSIS — W010XXA Fall on same level from slipping, tripping and stumbling without subsequent striking against object, initial encounter: Secondary | ICD-10-CM | POA: Diagnosis not present

## 2023-10-19 DIAGNOSIS — I10 Essential (primary) hypertension: Secondary | ICD-10-CM | POA: Diagnosis not present

## 2023-10-19 DIAGNOSIS — S81811A Laceration without foreign body, right lower leg, initial encounter: Secondary | ICD-10-CM

## 2023-10-19 NOTE — ED Triage Notes (Addendum)
 Pt comes with laceration to leg. Pt was seen here last night  and roomed. Pt was given tetanus shot but left before the doctor saw him.

## 2023-10-19 NOTE — Discharge Instructions (Addendum)
 You have been seen in the Emergency Department (ED) today for a laceration (cut).  We were able to close it with skin glue and/or tape.  Please keep the wound dry for about 24 hours.  At that point you can get it wet, in the shower, for example, but do not submerge it in water.  In 1 to 2 weeks the glue and/or tape will start to come off on its own.  Please do not pull it off early, allow it to fall off on its own.  If there are edges that are starting to pull up, you can trim them with a clean pair of small scissors.  Please take Tylenol  (acetaminophen ) as needed for discomfort as written on the box.   Please follow up with your doctor as soon as possible regarding today's emergent visit.   Return to the ED or call your doctor if you notice any signs of infection such as fever, increased pain, increased redness, pus, or other symptoms that concern you.   Please follow-up with the wound care center listed here in the next 48 hours. You will need to give their office a call. I have provided that information for you.

## 2023-10-19 NOTE — ED Provider Notes (Signed)
 [facility] Provider Note    Event Date/Time   First MD Initiated Contact with Patient 10/19/23 1102     (approximate)   History   Laceration   HPI  THAISON KOLODZIEJSKI is a 88 y.o. male  with a past medical history of hypertension, anxiety, degenerative disc disease, GERD, BPH, hyperlipidemia presents to the emergency department for evaluation of wound to his left lower leg on the posterior aspect.  Review of the chart shows patient was seen here yesterday for a laceration to the same area, but eloped before wound care/laceration repair could be completed, but did receive LET gel and updated Tdap.  Patient reports he fell backwards onto a piece of equipment after tripping in his shop around 3 or 4 PM yesterday and had bleeding after that.  Patient reports his wife cleansed the area with soap and water and peroxide and wrapped the area at home last night with Steri-Strips after leaving the ER.   Physical Exam   Triage Vital Signs: ED Triage Vitals  Encounter Vitals Group     BP 10/19/23 1035 (!) 152/104     Girls Systolic BP Percentile --      Girls Diastolic BP Percentile --      Boys Systolic BP Percentile --      Boys Diastolic BP Percentile --      Pulse Rate 10/19/23 1035 79     Resp 10/19/23 1035 (!) 98     Temp 10/19/23 1035 (!) 97.5 F (36.4 C)     Temp src --      SpO2 10/19/23 1035 98 %     Weight --      Height --      Head Circumference --      Peak Flow --      Pain Score 10/19/23 1033 2     Pain Loc --      Pain Education --      Exclude from Growth Chart --     Most recent vital signs: Vitals:   10/19/23 1152 10/19/23 1155  BP: (!) 167/81   Pulse:    Resp:  16  Temp:    SpO2:      General: Awake, in no acute distress. Appears stated age. CV: Good peripheral perfusion. DP pulses 2+ b/l. Respiratory:Normal respiratory effort.  No respiratory distress.  MSK: Normal ROM and  5/5 strength in b/l knees and ankles.  Skin:Warm,  dry. Left lower leg shows a wound as seen below in the media image wrapped with multiple steri-strips, has some dried blood but no evidence of pus-like drainage.     ED Results / Procedures / Treatments   Labs (all labs ordered are listed, but only abnormal results are displayed) Labs Reviewed - No data to display   EKG     RADIOLOGY    PROCEDURES:  Critical Care performed: No   Procedures   MEDICATIONS ORDERED IN ED: Medications - No data to display   IMPRESSION / MDM / ASSESSMENT AND PLAN / ED COURSE  I reviewed the triage vital signs and the nursing notes.                              Differential diagnosis includes, but is not limited to, skin tear, laceration, abrasion  Patient's presentation is most consistent with acute, uncomplicated illness.  Patient is a 88 year old male presenting for evaluation and treatment of  left lower extremity skin tear versus laceration.  Please see media image above for wound care completed at home.  Per review of the chart it seems like triage recorded 98 as the respiration count of the patient, but this patient is breathing normally, no respiratory distress in the room, moving air in both lungs easily with a new RR of 16.  Reviewed note from yesterday where patient was seen by Rebbeca Minerva, PA-C and eloped before care was completed. Patient is neurovascularly intact, told him I could unwrap the area but discussed that his wife did an excellent job with steri-strip application after subjectively cleansing the wound and this could cause more tearing of the skin. Since it has been >12 hours since the incident, I told him the only thing I may do differently if there were a deep laceration would be to loosely close the lac versus leaving the wound as is after steri-strip dressing completed from his wife and follow up with our local wound care center.  Patient is in agreement to letting the area stay wrapped as is with f/u with wound care  center.  I did rewrap over the Steri-Strips with a non-adhesive dressing followed by [place].  Wound care instructions were discussed with him and provided for him. Offered prophylactic antibiotics but patient denied at this time. Did give him follow-up with the local wound care center and told him to follow-up with his primary care provider as well.     The patient may return to the emergency department for any new, worsening, or concerning symptoms. Patient was given the opportunity to ask questions; all questions were answered. Emergency department return precautions were discussed with the patient.  Patient is in agreement to the treatment plan.  Patient is stable for discharge.   FINAL CLINICAL IMPRESSION(S) / ED DIAGNOSES   Final diagnoses:  Noninfected skin tear of right lower extremity, initial encounter  Laceration of right lower extremity, subsequent encounter     Rx / DC Orders   ED Discharge Orders     None        Note:  This document was prepared using Dragon voice recognition software and may include unintentional dictation errors.     Sheron Salm, PA-C 10/19/23 1238    Suzanne Kirsch, MD 10/19/23 [phone]
